# Patient Record
Sex: Female | Born: 1985 | Race: Black or African American | Hispanic: No | Marital: Married | Smoking: Never smoker
Health system: Southern US, Community
[De-identification: ages and names within clinical notes are randomized; demographics above are authoritative.]

## PROBLEM LIST (undated history)

## (undated) DIAGNOSIS — IMO0002 Reserved for concepts with insufficient information to code with codable children: Secondary | ICD-10-CM

## (undated) DIAGNOSIS — N870 Mild cervical dysplasia: Secondary | ICD-10-CM

## (undated) DIAGNOSIS — R51 Headache: Secondary | ICD-10-CM

## (undated) DIAGNOSIS — Z8719 Personal history of other diseases of the digestive system: Secondary | ICD-10-CM

## (undated) DIAGNOSIS — R87629 Unspecified abnormal cytological findings in specimens from vagina: Secondary | ICD-10-CM

## (undated) DIAGNOSIS — B977 Papillomavirus as the cause of diseases classified elsewhere: Secondary | ICD-10-CM

## (undated) DIAGNOSIS — Z8742 Personal history of other diseases of the female genital tract: Secondary | ICD-10-CM

## (undated) DIAGNOSIS — L0293 Carbuncle, unspecified: Secondary | ICD-10-CM

## (undated) DIAGNOSIS — R102 Pelvic and perineal pain: Secondary | ICD-10-CM

## (undated) DIAGNOSIS — Z8619 Personal history of other infectious and parasitic diseases: Secondary | ICD-10-CM

## (undated) DIAGNOSIS — Z87440 Personal history of urinary (tract) infections: Secondary | ICD-10-CM

## (undated) HISTORY — DX: Headache: R51

## (undated) HISTORY — DX: Mild cervical dysplasia: N87.0

## (undated) HISTORY — DX: Pelvic and perineal pain: R10.2

## (undated) HISTORY — DX: Personal history of other diseases of the digestive system: Z87.19

## (undated) HISTORY — DX: Personal history of other diseases of the female genital tract: Z87.42

## (undated) HISTORY — DX: Personal history of other infectious and parasitic diseases: Z86.19

## (undated) HISTORY — DX: Carbuncle, unspecified: L02.93

## (undated) HISTORY — PX: CRYOABLATION: SHX1415

## (undated) HISTORY — PX: WRIST SURGERY: SHX841

## (undated) HISTORY — DX: Personal history of urinary (tract) infections: Z87.440

## (undated) HISTORY — DX: Papillomavirus as the cause of diseases classified elsewhere: B97.7

---
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
## 2005-01-08 DIAGNOSIS — B977 Papillomavirus as the cause of diseases classified elsewhere: Secondary | ICD-10-CM

## 2005-01-08 DIAGNOSIS — R102 Pelvic and perineal pain: Secondary | ICD-10-CM

## 2005-01-08 HISTORY — DX: Pelvic and perineal pain: R10.2

## 2005-01-08 HISTORY — DX: Papillomavirus as the cause of diseases classified elsewhere: B97.7

## 2005-02-27 ENCOUNTER — Ambulatory Visit (HOSPITAL_COMMUNITY): Admission: RE | Admit: 2005-02-27 | Discharge: 2005-02-27 | Payer: Self-pay | Admitting: Obstetrics and Gynecology

## 2005-02-27 ENCOUNTER — Encounter (INDEPENDENT_AMBULATORY_CARE_PROVIDER_SITE_OTHER): Payer: Self-pay | Admitting: *Deleted

## 2005-05-22 ENCOUNTER — Other Ambulatory Visit: Admission: RE | Admit: 2005-05-22 | Discharge: 2005-05-22 | Payer: Self-pay | Admitting: Obstetrics and Gynecology

## 2005-10-21 ENCOUNTER — Other Ambulatory Visit: Admission: RE | Admit: 2005-10-21 | Discharge: 2005-10-21 | Payer: Self-pay | Admitting: Obstetrics and Gynecology

## 2005-12-23 ENCOUNTER — Other Ambulatory Visit: Admission: RE | Admit: 2005-12-23 | Discharge: 2005-12-23 | Payer: Self-pay | Admitting: Obstetrics and Gynecology

## 2006-05-07 ENCOUNTER — Other Ambulatory Visit: Admission: RE | Admit: 2006-05-07 | Discharge: 2006-05-07 | Payer: Self-pay | Admitting: Obstetrics and Gynecology

## 2009-03-08 DIAGNOSIS — Z8719 Personal history of other diseases of the digestive system: Secondary | ICD-10-CM

## 2009-03-08 HISTORY — DX: Personal history of other diseases of the digestive system: Z87.19

## 2009-12-08 HISTORY — PX: WISDOM TOOTH EXTRACTION: SHX21

## 2009-12-08 HISTORY — PX: MOUTH SURGERY: SHX715

## 2010-11-14 ENCOUNTER — Emergency Department (HOSPITAL_COMMUNITY)
Admission: EM | Admit: 2010-11-14 | Discharge: 2010-11-14 | Payer: Self-pay | Source: Home / Self Care | Admitting: Emergency Medicine

## 2011-01-08 DIAGNOSIS — L0293 Carbuncle, unspecified: Secondary | ICD-10-CM

## 2011-01-08 HISTORY — DX: Carbuncle, unspecified: L02.93

## 2011-02-18 LAB — POCT PREGNANCY, URINE: Preg Test, Ur: NEGATIVE

## 2011-04-23 LAB — HEPATITIS B SURFACE ANTIGEN: Hepatitis B Surface Ag: NEGATIVE

## 2011-04-23 LAB — CBC: HCT: 38 % (ref 36–46)

## 2011-04-25 NOTE — Op Note (Signed)
NAME:  Tasha Martin, Tasha Martin                 ACCOUNT NO.:  [id number]   MEDICAL RECORD NO.:  [id number]          PATIENT TYPE:  AMB   LOCATION:  SDC                           FACILITY:  [facility]   PHYSICIAN:  Hal Morales, M.D.DATE OF BIRTH:  07-27-1986   DATE OF PROCEDURE:  02/27/2005  DATE OF DISCHARGE:                                 OPERATIVE REPORT   PREOPERATIVE DIAGNOSIS:  Abnormal genital cytologies, low grade squamous  intraepithelial lesion, rule out endocervical glandular disease   POSTOPERATIVE DIAGNOSIS:  Abnormal genital cytologies, low grade squamous  intraepithelial lesion, rule out endocervical glandular disease   OPERATION:  Cold knife conization of the cervix.   SURGEON:  Hal Morales, M.D.   ANESTHESIA:  Monitored anesthesia care and local.   ESTIMATED BLOOD LOSS:  Less than 25 mL.   COMPLICATIONS:  None.   FINDINGS:  There were no Lugol's nonstaining areas on examination.   PROCEDURE:  A discussion was held with the patient preoperatively concerning  the indications for her procedure as well as the risks involved.  The  patient had a suggestion of endocervical glandular disease on her Pap smear,  low-grade SIL on her Pap smear, and no exocervical lesions noted on  colposcopy x2.  A recommendation was made for sampling of her endocervix  beyond the endocervical curettage.  This could be performed with cold knife  conization.  The risks of anesthesia, bleeding, infection and damage to  adjacent organs were reviewed.  The patient also gave a history of having  discontinued her birth control method approximately a month ago with an  normal menstrual period on February28.  She had intercourse approximately  two weeks ago and understands that the current negative pregnancy test may  not detect a pregnancy that occurred less than two weeks ago.  She  verbalized the desire to proceed rather than delay her procedure further.   PROCEDURE:  The patient was taken to the  operating room after appropriate  identification and placed on the operating table.  After placement of  equipment for monitored anesthesia care, she was placed in the lithotomy  position.  The perineum and vagina were prepped with multiple layers of  Betadine and draped as a sterile field.  A red Robinson catheter was  inserted into the bladder and the bladder emptied.  A weighted speculum was  placed in the posterior vagina.  A paracervical block was achieved with a  total of 10 mL of 2% Xylocaine.  The cervix was then infiltrated with 8 mL  of dilute Pitressin.  Sutures were placed and the 3 and 9 o'clock positions  and tied down.  A cone-shaped portion of the cervix consisting primarily of  endocervix was then removed.  Sturmdorf sutures were placed at the 12 and 6  o'clock position and a piece of Gelfoam  placed in the endocervical cone  bed.  Hemostasis was noted be adequate, and all instruments were removed  from the vagina.  The patient was taken from the  operating room to the recovery room in satisfactory condition having  tolerated  the procedure well, with sponge and instrument counts correct.   SPECIMENS TO PATHOLOGY:  Conization of the cervix.      VPH/MEDQ  D:  02/27/2005  T:  02/27/2005  Job:  [id number]

## 2011-04-25 NOTE — Op Note (Signed)
[facility]  Patient:    Tasha Martin, Tasha Martin                        MRN: [id number] Proc. Date: 03/17/00 Adm. Date:  [id number] Attending:  Sypher, Douglass Rivers CC:         Katy Fitch. Sypher, Montez Hageman., M.D. (2)                           Operative Report  PREOPERATIVE DIAGNOSIS:  Recurrent right dorsal ganglion.  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  OPERATION PERFORMED:  Excision of recurrent dorsal ganglion and arthrotomy of wrist joint with debridement of scapholunate ligament.  SURGEON:  Katy Fitch. Sypher, Montez Hageman., M.D.  ASSISTANT:  Jaquelyn Bitter. Chabon, P.A.  ANESTHESIA:  General LMA.  SUPERVISING ANESTHESIOLOGIST:  Dr. Katrinka Blazing.  INDICATIONS FOR PROCEDURE:  The patient is a 25 year old girl who is status post removal of a dorsal ganglion last year.  Her family was advised prior to the first surgery that ganglions could recur.  Several months after the surgery, she noted a recurrent mass on the dorsal aspect of her right wrist which enlarged and became painful.  She was noted to have a multilobular ganglion clinically.  She returned to the operating room at this time for a second attempt at resection and resolution of this right dorsal ganglion.  DESCRIPTION OF PROCEDURE:   The patient was brought to the operating room and placed in supine position on the operating table.  Following induction of general anesthesia by LMA, the right arm was prepped with Betadine soap and solution and sterilely draped.  Following exsanguination of the limb with an Esmarch bandage, the arterial tourniquet was inflated to 220 mmHg.  The procedure commenced with excision of the previous surgical scar.  Subcutaneous tissues were carefully divided taking care to identify the extensor retinaculum and preserve its transverse fibers.  The cyst was dissected circumferentially between the  second and fourth dorsal compartments and followed down to the dorsal wrist capsule. This was isolated to a stalk that emanated directly off the dorsal aspect of the scapholunate ligament.  A radial carpal arthrotomy was completed and the stalk excised.  With wrist motion, there was noted to be mucinous material extruding from the scapholunate ligament.  This was curetted with a House curet and electrocauterized.  It is likely that this is the source of the recurring ganglion.  I carefully electrocauterized the feeding vessels on the dorsal capsule.  The capsule was repaired with mattress suture of 3-0 Vicryl followed by repair of the skin with intradermal 3-0 Prolene.  A compressive dressing was applied with Xeroflo, sterile gauze and a volar plaster splint. DD:  03/17/00 TD:  03/17/00 Job: [id number]

## 2011-05-08 ENCOUNTER — Inpatient Hospital Stay (HOSPITAL_COMMUNITY)
Admission: AD | Admit: 2011-05-08 | Discharge: 2011-05-08 | Disposition: A | Discharge status: Home / Self Care | Payer: 59 | Source: Ambulatory Visit | Attending: Obstetrics and Gynecology | Admitting: Obstetrics and Gynecology

## 2011-05-08 DIAGNOSIS — R51 Headache: Secondary | ICD-10-CM | POA: Insufficient documentation

## 2011-05-08 DIAGNOSIS — O99891 Other specified diseases and conditions complicating pregnancy: Secondary | ICD-10-CM | POA: Insufficient documentation

## 2011-05-08 DIAGNOSIS — E860 Dehydration: Secondary | ICD-10-CM | POA: Insufficient documentation

## 2011-05-08 LAB — URINALYSIS, ROUTINE W REFLEX MICROSCOPIC
Bilirubin Urine: NEGATIVE
Glucose, UA: NEGATIVE mg/dL
Hgb urine dipstick: NEGATIVE
Ketones, ur: NEGATIVE mg/dL
Nitrite: NEGATIVE
Protein, ur: NEGATIVE mg/dL
Specific Gravity, Urine: 1.025 (ref 1.005–1.030)
Urobilinogen, UA: 0.2 mg/dL (ref 0.0–1.0)
pH: 6 (ref 5.0–8.0)

## 2011-09-10 ENCOUNTER — Inpatient Hospital Stay (HOSPITAL_COMMUNITY)
Admission: AD | Admit: 2011-09-10 | Discharge: 2011-09-10 | Disposition: A | Discharge status: Home / Self Care | Payer: 59 | Source: Ambulatory Visit | Attending: Obstetrics and Gynecology | Admitting: Obstetrics and Gynecology

## 2011-09-10 DIAGNOSIS — Z3480 Encounter for supervision of other normal pregnancy, unspecified trimester: Secondary | ICD-10-CM | POA: Insufficient documentation

## 2011-09-10 DIAGNOSIS — Z2989 Encounter for other specified prophylactic measures: Secondary | ICD-10-CM | POA: Insufficient documentation

## 2011-09-10 DIAGNOSIS — Z298 Encounter for other specified prophylactic measures: Secondary | ICD-10-CM | POA: Insufficient documentation

## 2011-09-10 LAB — ABO/RH: RH Type: NEGATIVE

## 2011-09-10 MED ORDER — RHO D IMMUNE GLOBULIN 1500 UNIT/2ML IJ SOLN
300.0000 ug | Freq: Once | INTRAMUSCULAR | Status: AC
  Administered 2011-09-10: 300 ug via INTRAMUSCULAR
  Filled 2011-09-10: qty 2

## 2011-09-10 NOTE — Progress Notes (Signed)
Sent from office with orders for blood work and rhophyllac.  NKDA.  Has never received previously. Handout given and explained time frame related to blood draw and injection.  [facility] 11/16/11

## 2011-09-11 LAB — RH IG WORKUP (INCLUDES ABO/RH)
ABO/RH(D): O NEG
Unit division: 0

## 2011-10-04 ENCOUNTER — Encounter (HOSPITAL_COMMUNITY): Payer: Self-pay | Admitting: *Deleted

## 2011-10-04 ENCOUNTER — Inpatient Hospital Stay (HOSPITAL_COMMUNITY): Payer: No Typology Code available for payment source

## 2011-10-04 ENCOUNTER — Observation Stay (HOSPITAL_COMMUNITY)
Admission: AD | Admit: 2011-10-04 | Discharge: 2011-10-05 | Disposition: A | Discharge status: Home / Self Care | Payer: No Typology Code available for payment source | Source: Ambulatory Visit | Attending: Obstetrics and Gynecology | Admitting: Obstetrics and Gynecology

## 2011-10-04 DIAGNOSIS — Y92410 Unspecified street and highway as the place of occurrence of the external cause: Secondary | ICD-10-CM | POA: Insufficient documentation

## 2011-10-04 DIAGNOSIS — O479 False labor, unspecified: Secondary | ICD-10-CM

## 2011-10-04 DIAGNOSIS — O4700 False labor before 37 completed weeks of gestation, unspecified trimester: Principal | ICD-10-CM | POA: Insufficient documentation

## 2011-10-04 HISTORY — DX: Reserved for concepts with insufficient information to code with codable children: IMO0002

## 2011-10-04 LAB — CBC
Hemoglobin: 10.9 g/dL — ABNORMAL LOW (ref 12.0–15.0)
MCH: 31.2 pg (ref 26.0–34.0)
MCHC: 32.9 g/dL (ref 30.0–36.0)
RDW: 12.8 % (ref 11.5–15.5)

## 2011-10-04 LAB — WET PREP, GENITAL
Trich, Wet Prep: NONE SEEN
Yeast Wet Prep HPF POC: NONE SEEN

## 2011-10-04 LAB — KLEIHAUER-BETKE STAIN: Fetal Cells %: 0 %

## 2011-10-04 MED ORDER — DOCUSATE SODIUM 100 MG PO CAPS
100.0000 mg | ORAL_CAPSULE | Freq: Every day | ORAL | Status: DC
  Administered 2011-10-05: 100 mg via ORAL
  Filled 2011-10-04 (×4): qty 1

## 2011-10-04 MED ORDER — PRENATAL PLUS 27-1 MG PO TABS
1.0000 | ORAL_TABLET | Freq: Every day | ORAL | Status: DC
  Administered 2011-10-05: 1 via ORAL
  Filled 2011-10-04: qty 1

## 2011-10-04 MED ORDER — LACTATED RINGERS IV SOLN
INTRAVENOUS | Status: DC
  Administered 2011-10-04 – 2011-10-05 (×4): via INTRAVENOUS

## 2011-10-04 MED ORDER — BETAMETHASONE SOD PHOS & ACET 6 (3-3) MG/ML IJ SUSP
12.5000 mg | INTRAMUSCULAR | Status: AC
  Administered 2011-10-04 – 2011-10-05 (×2): 12.5 mg via INTRAMUSCULAR
  Filled 2011-10-04 (×2): qty 2.1

## 2011-10-04 MED ORDER — TERBUTALINE SULFATE 1 MG/ML IJ SOLN: 0.2500 mg | Freq: Once | INTRAMUSCULAR | Status: AC | PRN

## 2011-10-04 MED ORDER — ZOLPIDEM TARTRATE 10 MG PO TABS: 10.0000 mg | ORAL_TABLET | Freq: Every evening | ORAL | Status: DC | PRN

## 2011-10-04 MED ORDER — CALCIUM CARBONATE ANTACID 500 MG PO CHEW
2.0000 | CHEWABLE_TABLET | ORAL | Status: DC | PRN
  Filled 2011-10-04: qty 2

## 2011-10-04 MED ORDER — ACETAMINOPHEN 325 MG PO TABS: 650.0000 mg | ORAL_TABLET | ORAL | Status: DC | PRN
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
## 2011-10-05 NOTE — Discharge Summary (Signed)
Discharge instructions reviewed with patient, copy given.  Patient verbalized understanding of all discharge instructions. No prescriptions given.
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
## 2011-10-07 LAB — STREP B DNA PROBE: Special Requests: NORMAL

## 2011-10-07 LAB — GC/CHLAMYDIA PROBE AMP, GENITAL: Chlamydia, DNA Probe: UNDETERMINED

## 2011-11-21 ENCOUNTER — Other Ambulatory Visit: Payer: Self-pay | Admitting: Obstetrics and Gynecology

## 2011-11-23 ENCOUNTER — Encounter (HOSPITAL_COMMUNITY): Payer: Self-pay | Admitting: Anesthesiology

## 2011-11-23 ENCOUNTER — Encounter (HOSPITAL_COMMUNITY)
Admission: AD | Disposition: A | Discharge status: Home / Self Care | Payer: Self-pay | Source: Ambulatory Visit | Attending: Obstetrics and Gynecology

## 2011-11-23 ENCOUNTER — Encounter (HOSPITAL_COMMUNITY): Payer: Self-pay | Admitting: Obstetrics and Gynecology

## 2011-11-23 ENCOUNTER — Inpatient Hospital Stay (HOSPITAL_COMMUNITY): Payer: 59 | Admitting: Anesthesiology

## 2011-11-23 ENCOUNTER — Inpatient Hospital Stay (HOSPITAL_COMMUNITY)
Admission: AD | Admit: 2011-11-23 | Discharge: 2011-11-26 | DRG: 766 | Disposition: A | Discharge status: Home / Self Care | Payer: 59 | Source: Ambulatory Visit | Attending: Obstetrics and Gynecology | Admitting: Obstetrics and Gynecology

## 2011-11-23 DIAGNOSIS — Z6791 Unspecified blood type, Rh negative: Secondary | ICD-10-CM

## 2011-11-23 DIAGNOSIS — Z22330 Carrier of Group B streptococcus: Secondary | ICD-10-CM

## 2011-11-23 DIAGNOSIS — Z3490 Encounter for supervision of normal pregnancy, unspecified, unspecified trimester: Secondary | ICD-10-CM

## 2011-11-23 DIAGNOSIS — Z98891 History of uterine scar from previous surgery: Secondary | ICD-10-CM

## 2011-11-23 DIAGNOSIS — O26899 Other specified pregnancy related conditions, unspecified trimester: Secondary | ICD-10-CM

## 2011-11-23 DIAGNOSIS — Z8742 Personal history of other diseases of the female genital tract: Secondary | ICD-10-CM

## 2011-11-23 DIAGNOSIS — O99892 Other specified diseases and conditions complicating childbirth: Secondary | ICD-10-CM | POA: Diagnosis present

## 2011-11-23 LAB — CBC
HCT: 35.5 % — ABNORMAL LOW (ref 36.0–46.0)
Hemoglobin: 11.7 g/dL — ABNORMAL LOW (ref 12.0–15.0)
MCHC: 33 g/dL (ref 30.0–36.0)
RDW: 15.3 % (ref 11.5–15.5)
WBC: 10.6 10*3/uL — ABNORMAL HIGH (ref 4.0–10.5)

## 2011-11-23 LAB — AMNISURE RUPTURE OF MEMBRANE (ROM) NOT AT ARMC
Amnisure ROM: INVALID
Amnisure ROM: NEGATIVE

## 2011-11-23 LAB — RPR: RPR Ser Ql: NONREACTIVE

## 2011-11-23 SURGERY — Surgical Case
Anesthesia: Epidural | Site: Abdomen | Wound class: Clean Contaminated

## 2011-11-23 MED ORDER — ACETAMINOPHEN 325 MG PO TABS: 650.0000 mg | ORAL_TABLET | ORAL | Status: DC | PRN

## 2011-11-23 MED ORDER — EPHEDRINE 5 MG/ML INJ: 10.0000 mg | INTRAVENOUS | Status: DC | PRN

## 2011-11-23 MED ORDER — LACTATED RINGERS IV SOLN
INTRAVENOUS | Status: DC
  Administered 2011-11-23: 19:00:00 via INTRAUTERINE

## 2011-11-23 MED ORDER — FENTANYL CITRATE 0.05 MG/ML IJ SOLN
INTRAMUSCULAR | Status: DC | PRN
  Administered 2011-11-23: 100 ug via INTRAVENOUS

## 2011-11-23 MED ORDER — PHENYLEPHRINE 40 MCG/ML (10ML) SYRINGE FOR IV PUSH (FOR BLOOD PRESSURE SUPPORT): 80.0000 ug | PREFILLED_SYRINGE | INTRAVENOUS | Status: DC | PRN

## 2011-11-23 MED ORDER — FLEET ENEMA 7-19 GM/118ML RE ENEM: 1.0000 | ENEMA | RECTAL | Status: DC | PRN

## 2011-11-23 MED ORDER — CEFAZOLIN SODIUM 1-5 GM-% IV SOLN
INTRAVENOUS | Status: AC
Start: 2011-11-23 — End: 2011-11-23
  Filled 2011-11-23: qty 100

## 2011-11-23 MED ORDER — OXYTOCIN BOLUS FROM INFUSION
500.0000 mL | Freq: Once | INTRAVENOUS | Status: DC
  Filled 2011-11-23: qty 500

## 2011-11-23 MED ORDER — LACTATED RINGERS IV SOLN
INTRAVENOUS | Status: DC | PRN
  Administered 2011-11-23 (×3): via INTRAVENOUS

## 2011-11-23 MED ORDER — MORPHINE SULFATE (PF) 0.5 MG/ML IJ SOLN
INTRAMUSCULAR | Status: DC | PRN
  Administered 2011-11-23: 4 mg via EPIDURAL

## 2011-11-23 MED ORDER — OXYTOCIN 10 UNIT/ML IJ SOLN
INTRAMUSCULAR | Status: AC
  Filled 2011-11-23: qty 4

## 2011-11-23 MED ORDER — LIDOCAINE HCL (PF) 1 % IJ SOLN: 30.0000 mL | INTRAMUSCULAR | Status: DC | PRN

## 2011-11-23 MED ORDER — ONDANSETRON HCL 4 MG/2ML IJ SOLN: 4.0000 mg | Freq: Four times a day (QID) | INTRAMUSCULAR | Status: DC | PRN

## 2011-11-23 MED ORDER — OXYTOCIN 20 UNITS IN LACTATED RINGERS INFUSION - SIMPLE
125.0000 mL/h | Freq: Once | INTRAVENOUS | Status: DC
  Filled 2011-11-23: qty 1000

## 2011-11-23 MED ORDER — OXYTOCIN 20 UNITS IN LACTATED RINGERS INFUSION - SIMPLE
1.0000 m[IU]/min | INTRAVENOUS | Status: DC
  Administered 2011-11-23: 1 m[IU]/min via INTRAVENOUS

## 2011-11-23 MED ORDER — CITRIC ACID-SODIUM CITRATE 334-500 MG/5ML PO SOLN
30.0000 mL | Freq: Once | ORAL | Status: AC
  Administered 2011-11-23: 30 mL via ORAL

## 2011-11-23 MED ORDER — LIDOCAINE HCL 1.5 % IJ SOLN
INTRAMUSCULAR | Status: DC | PRN
  Administered 2011-11-23 (×2): 5 mL via EPIDURAL

## 2011-11-23 MED ORDER — DIPHENHYDRAMINE HCL 50 MG/ML IJ SOLN: 12.5000 mg | INTRAMUSCULAR | Status: DC | PRN

## 2011-11-23 MED ORDER — LACTATED RINGERS IV SOLN: 500.0000 mL | Freq: Once | INTRAVENOUS | Status: DC

## 2011-11-23 MED ORDER — MEPERIDINE HCL 25 MG/ML IJ SOLN
INTRAMUSCULAR | Status: DC | PRN
  Administered 2011-11-23: 25 mg via INTRAVENOUS

## 2011-11-23 MED ORDER — TERBUTALINE SULFATE 1 MG/ML IJ SOLN: 0.2500 mg | Freq: Once | INTRAMUSCULAR | Status: DC | PRN

## 2011-11-23 MED ORDER — FENTANYL CITRATE 0.05 MG/ML IJ SOLN
INTRAMUSCULAR | Status: AC
  Filled 2011-11-23: qty 2

## 2011-11-23 MED ORDER — BUPIVACAINE-EPINEPHRINE 0.5% -1:200000 IJ SOLN
INTRAMUSCULAR | Status: DC | PRN
  Administered 2011-11-23: 10 mL

## 2011-11-23 MED ORDER — SODIUM BICARBONATE 8.4 % IV SOLN
INTRAVENOUS | Status: AC
  Filled 2011-11-23: qty 50

## 2011-11-23 MED ORDER — PHENYLEPHRINE 40 MCG/ML (10ML) SYRINGE FOR IV PUSH (FOR BLOOD PRESSURE SUPPORT)
PREFILLED_SYRINGE | INTRAVENOUS | Status: AC
  Filled 2011-11-23: qty 5

## 2011-11-23 MED ORDER — PHENYLEPHRINE 40 MCG/ML (10ML) SYRINGE FOR IV PUSH (FOR BLOOD PRESSURE SUPPORT)
PREFILLED_SYRINGE | INTRAVENOUS | Status: AC
  Filled 2011-11-23: qty 10

## 2011-11-23 MED ORDER — OXYCODONE-ACETAMINOPHEN 5-325 MG PO TABS: 2.0000 | ORAL_TABLET | ORAL | Status: DC | PRN

## 2011-11-23 MED ORDER — SODIUM BICARBONATE 8.4 % IV SOLN
INTRAVENOUS | Status: DC | PRN
  Administered 2011-11-23: 5 mL via EPIDURAL

## 2011-11-23 MED ORDER — EPHEDRINE 5 MG/ML INJ
10.0000 mg | INTRAVENOUS | Status: DC | PRN
  Filled 2011-11-23: qty 4

## 2011-11-23 MED ORDER — SODIUM CHLORIDE 0.9 % IR SOLN
Status: DC | PRN
  Administered 2011-11-23 (×4): 1

## 2011-11-23 MED ORDER — OXYTOCIN 10 UNIT/ML IJ SOLN
20.0000 [IU] | INTRAVENOUS | Status: DC | PRN
  Administered 2011-11-23: 20 [IU] via INTRAVENOUS

## 2011-11-23 MED ORDER — MEPERIDINE HCL 25 MG/ML IJ SOLN
INTRAMUSCULAR | Status: AC
Start: 2011-11-23 — End: 2011-11-23
  Filled 2011-11-23: qty 1

## 2011-11-23 MED ORDER — LACTATED RINGERS IV SOLN
INTRAVENOUS | Status: DC
  Administered 2011-11-23 (×2): via INTRAVENOUS

## 2011-11-23 MED ORDER — PENICILLIN G POTASSIUM 5000000 UNITS IJ SOLR
2.5000 10*6.[IU] | INTRAMUSCULAR | Status: DC
  Administered 2011-11-23 (×2): 2.5 10*6.[IU] via INTRAVENOUS
  Filled 2011-11-23 (×7): qty 2.5

## 2011-11-23 MED ORDER — PHENYLEPHRINE 40 MCG/ML (10ML) SYRINGE FOR IV PUSH (FOR BLOOD PRESSURE SUPPORT)
80.0000 ug | PREFILLED_SYRINGE | INTRAVENOUS | Status: DC | PRN
  Filled 2011-11-23: qty 5

## 2011-11-23 MED ORDER — ONDANSETRON HCL 4 MG/2ML IJ SOLN
INTRAMUSCULAR | Status: AC
  Filled 2011-11-23: qty 2

## 2011-11-23 MED ORDER — MORPHINE SULFATE (PF) 0.5 MG/ML IJ SOLN
INTRAMUSCULAR | Status: DC | PRN
  Administered 2011-11-23: 1 mg via INTRAVENOUS

## 2011-11-23 MED ORDER — LACTATED RINGERS IV SOLN: 500.0000 mL | INTRAVENOUS | Status: DC | PRN

## 2011-11-23 MED ORDER — CITRIC ACID-SODIUM CITRATE 334-500 MG/5ML PO SOLN: 30.0000 mL | ORAL | Status: DC | PRN

## 2011-11-23 MED ORDER — CITRIC ACID-SODIUM CITRATE 334-500 MG/5ML PO SOLN
ORAL | Status: AC
  Filled 2011-11-23: qty 15

## 2011-11-23 MED ORDER — MORPHINE SULFATE 0.5 MG/ML IJ SOLN
INTRAMUSCULAR | Status: AC
  Filled 2011-11-23: qty 10

## 2011-11-23 MED ORDER — PENICILLIN G POTASSIUM 5000000 UNITS IJ SOLR
5.0000 10*6.[IU] | Freq: Once | INTRAMUSCULAR | Status: AC
  Administered 2011-11-23: 5 10*6.[IU] via INTRAVENOUS
  Filled 2011-11-23: qty 5

## 2011-11-23 MED ORDER — IBUPROFEN 600 MG PO TABS: 600.0000 mg | ORAL_TABLET | Freq: Four times a day (QID) | ORAL | Status: DC | PRN

## 2011-11-23 MED ORDER — FENTANYL 2.5 MCG/ML BUPIVACAINE 1/10 % EPIDURAL INFUSION (WH - ANES)
INTRAMUSCULAR | Status: DC | PRN
  Administered 2011-11-23: 14 mL/h via EPIDURAL

## 2011-11-23 MED ORDER — PHENYLEPHRINE HCL 10 MG/ML IJ SOLN
INTRAMUSCULAR | Status: DC | PRN
  Administered 2011-11-23 (×2): 120 ug via INTRAVENOUS
  Administered 2011-11-23 (×2): 80 ug via INTRAVENOUS

## 2011-11-23 MED ORDER — CEFAZOLIN SODIUM 1-5 GM-% IV SOLN
INTRAVENOUS | Status: DC | PRN
  Administered 2011-11-23: 2 g via INTRAVENOUS

## 2011-11-23 MED ORDER — ONDANSETRON HCL 4 MG/2ML IJ SOLN
INTRAMUSCULAR | Status: DC | PRN
  Administered 2011-11-23: 4 mg via INTRAVENOUS

## 2011-11-23 MED ORDER — FENTANYL 2.5 MCG/ML BUPIVACAINE 1/10 % EPIDURAL INFUSION (WH - ANES)
14.0000 mL/h | INTRAMUSCULAR | Status: DC
  Filled 2011-11-23: qty 60

## 2011-11-23 MED ORDER — FENTANYL 2.5 MCG/ML BUPIVACAINE 1/10 % EPIDURAL INFUSION (WH - ANES): 14.0000 mL/h | INTRAMUSCULAR | Status: DC

## 2011-11-23 MED ORDER — BUTORPHANOL TARTRATE 2 MG/ML IJ SOLN
2.0000 mg | INTRAMUSCULAR | Status: DC | PRN
  Administered 2011-11-23 (×2): 2 mg via INTRAVENOUS
  Filled 2011-11-23 (×2): qty 1

## 2011-11-23 MED ORDER — LIDOCAINE-EPINEPHRINE (PF) 2 %-1:200000 IJ SOLN
INTRAMUSCULAR | Status: AC
  Filled 2011-11-23: qty 20

## 2011-11-23 SURGICAL SUPPLY — 40 items
CHLORAPREP W/TINT 26ML (MISCELLANEOUS) ×2 IMPLANT
CLOTH BEACON ORANGE TIMEOUT ST (SAFETY) ×2 IMPLANT
CONTAINER PREFILL 10% NBF 15ML (MISCELLANEOUS) IMPLANT
DRAIN JACKSON PRT FLT 7MM (DRAIN) IMPLANT
DRESSING TELFA 8X3 (GAUZE/BANDAGES/DRESSINGS) ×2 IMPLANT
ELECT REM PT RETURN 9FT ADLT (ELECTROSURGICAL) ×2
ELECTRODE REM PT RTRN 9FT ADLT (ELECTROSURGICAL) ×1 IMPLANT
EVACUATOR SILICONE 100CC (DRAIN) IMPLANT
EXTRACTOR VACUUM M CUP 4 TUBE (SUCTIONS) IMPLANT
GAUZE SPONGE 4X4 12PLY STRL LF (GAUZE/BANDAGES/DRESSINGS) ×3 IMPLANT
GLOVE BIOGEL PI IND STRL 8.5 (GLOVE) ×1 IMPLANT
GLOVE BIOGEL PI INDICATOR 8.5 (GLOVE) ×1
GLOVE ECLIPSE 8.0 STRL XLNG CF (GLOVE) ×4 IMPLANT
GOWN PREVENTION PLUS LG XLONG (DISPOSABLE) ×4 IMPLANT
GOWN PREVENTION PLUS XXLARGE (GOWN DISPOSABLE) ×2 IMPLANT
KIT ABG SYR 3ML LUER SLIP (SYRINGE) ×1 IMPLANT
NDL HYPO 25X1 1.5 SAFETY (NEEDLE) ×1 IMPLANT
NDL HYPO 25X5/8 SAFETYGLIDE (NEEDLE) IMPLANT
NEEDLE HYPO 25X1 1.5 SAFETY (NEEDLE) ×2 IMPLANT
NEEDLE HYPO 25X5/8 SAFETYGLIDE (NEEDLE) ×2 IMPLANT
PACK C SECTION WH (CUSTOM PROCEDURE TRAY) ×2 IMPLANT
PAD ABD 7.5X8 STRL (GAUZE/BANDAGES/DRESSINGS) ×2 IMPLANT
RINGERS IRRIG 1000ML POUR BTL (IV SOLUTION) ×2 IMPLANT
SLEEVE SCD COMPRESS KNEE MED (MISCELLANEOUS) ×1 IMPLANT
STAPLER VISISTAT 35W (STAPLE) IMPLANT
SUT MNCRL AB 3-0 PS2 27 (SUTURE) IMPLANT
SUT PLAIN 0 NONE (SUTURE) IMPLANT
SUT SILK 3 0 FS 1X18 (SUTURE) IMPLANT
SUT VIC AB 0 CT1 27 (SUTURE) ×4
SUT VIC AB 0 CT1 27XBRD ANBCTR (SUTURE) ×2 IMPLANT
SUT VIC AB 2-0 CTX 36 (SUTURE) ×5 IMPLANT
SUT VIC AB 3-0 CT1 27 (SUTURE)
SUT VIC AB 3-0 CT1 TAPERPNT 27 (SUTURE) IMPLANT
SUT VIC AB 3-0 SH 27 (SUTURE)
SUT VIC AB 3-0 SH 27X BRD (SUTURE) IMPLANT
SYR CONTROL 10ML LL (SYRINGE) ×2 IMPLANT
TAPE CLOTH SURG 4X10 WHT LF (GAUZE/BANDAGES/DRESSINGS) ×1 IMPLANT
TOWEL OR 17X24 6PK STRL BLUE (TOWEL DISPOSABLE) ×4 IMPLANT
TRAY FOLEY CATH 14FR (SET/KITS/TRAYS/PACK) ×1 IMPLANT
WATER STERILE IRR 1000ML POUR (IV SOLUTION) ×2 IMPLANT

## 2011-11-23 NOTE — Anesthesia Postprocedure Evaluation (Signed)
Anesthesia Post Note  Patient: Tasha Martin  Procedure(s) Performed:  CESAREAN SECTION - primary cesarean section of baby girl at 2248  APGAR  9/9  Anesthesia type: Epidural  Patient location: PACU  Post pain: Pain level controlled  Post assessment: Post-op Vital signs reviewed  Last Vitals:  Filed Vitals:   11/23/11 2332  BP: 120/54  Pulse: 104  Temp: 36.9 C  Resp: 16    Post vital signs: Reviewed  Level of consciousness: awake  Complications: No apparent anesthesia complications

## 2011-11-23 NOTE — Progress Notes (Signed)
Patient ID: Tasha Martin, female   DOB: 1986/02/05, 25 y.o.   MRN: [id number] .Subjective: Remains comfortable with epidural   Objective: BP 119/69  Pulse 110  Temp(Src) 98.1 F (36.7 C) (Oral)  Resp 18  Ht 5\' 5"  (1.651 m)  Wt 96.616 kg (213 lb)  BMI 35.44 kg/m2  LMP 02/09/2011   FHT:  FHR: 150 bpm, variability: moderate,  accelerations:  Abscent,  decelerations:  Present varibles, prolonged UC:   regular, every 3-6 minutes, remains dysfunctional SVE:   Dilation: 4 Effacement (%): 100 Station: -1 Exam by:: S. Kamyia Thomason, CNM  Pitocin off  Assessment / Plan: minimal cervical change, FHR decels GBS pos   Fetal Wellbeing:  Category II Pain Control:  Epidural  D/W Dr Stefano Gaul will proceed with C/S Reviewed R/B/A with pt including risks of infection, bleeding, anesthesia complications or damage to internal organs. Pt verbalized understanding and agrees to proceed with C/S.   Malissa Hippo 11/23/2011, 10:06 PM
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
## 2011-11-23 NOTE — OR Nursing (Signed)
Fundal massage DLWegner RN , cord ph 7.26

## 2011-11-23 NOTE — Op Note (Signed)
OPERATIVE NOTE  Patient's Name: Tasha Martin  Date of Birth: 09/13/1986  Medical Records Number: [id number]  Date of Operation: 11/23/2011  Preoperative diagnosis:  [redacted]w[redacted]d weeks gestation  fetal intolerance to labor  Failure to progress in labor  Postoperative diagnosis:  [redacted]w[redacted]d weeks gestation  fetal intolerance to labor  Failure to progress in labor  Occiput posterior presentation  Procedure: Primary CESAREAN SECTION  Surgeon:  Leonard Schwartz, M.D.  Assistant:  Sanda Klein, certified nurse midwife  Anesthesia:  Epidural  Disposition:  [name] is a 25 y.o. female, gravida 1 para 0-0-0-0, who presents at [redacted]w[redacted]d weeks gestation. The patient has been followed at the [facility] obstetrics and gynecology division of [facility]. She has the above mentioned diagnosis. She understands the indications for her procedure and she accepts the risk of, but not limited to, anesthetic complications, bleeding, infections, and possible damage to the surrounding organs.  Findings:  A 7 pound 6 ounce female ([name]) was delivered from a occiput posterior position.  The Apgar scores were 9/9 . The uterus, fallopian tubes, and ovaries were normal for the gravid state. The arterial cord blood pH was 7.26.  Procedure:  The patient was taken to the operating room where it was determined that the epidural she received her labor would be adequate for cesarean delivery. The patient's abdomen was prepped with Chloraprep. A Foley catheter had previously been placed The patient was sterilely draped. The lower abdomen was injected with half percent Marcaine with epinephrine. A low transverse incision was made in the abdomen and carried sharply through the subcutaneous tissue, the fascia, and the anterior peritoneum. An incision was made in the lower uterine segment. The incision was extended in a low transverse fashion. The membranes were ruptured. The  fetal head was delivered without difficulty. The mouth and nose were suctioned. The remainder of the infant was then delivered. The cord was clamped and cut. The infant was handed to the awaiting pediatric team. The placenta was removed. The uterine cavity was cleaned of amniotic fluid, clotted blood, and membranes. The uterine incision was closed using a running locking suture of 2-0 Vicryl. An imbricating suture of 2-0 Vicryl was placed. The pelvis was vigorously irrigated. Hemostasis was adequate. The anterior peritoneum and the abdominal musculature were closed using 2-0 Vicryl. The fascia was closed using a running suture of 0 Vicryl followed by 3 interrupted sutures of 0 Vicryl. The subcutaneous layer was closed using interrupted sutures of 2-0 Vicryl. The skin was reapproximated using a subcuticular suture of 3-0 Monocryl. Sponge, needle, and instrument counts were correct on 2 occasions. The estimated blood loss for the procedure was 600 cc. The patient tolerated her procedure well. She was transported to the recovery room in stable condition. The infant was taken to the full-term nursery in stable condition. The placenta was sent to labor and delivery.  Leonard Schwartz, M.D.

## 2011-11-23 NOTE — Progress Notes (Signed)
Patient ID: HAYLEN BELLOTTI, female   DOB: 21-Jan-1986, 25 y.o.   MRN: [id number] .Subjective:  Up walking in hall  Objective: BP 119/64  Pulse 90  Temp(Src) 98.9 F (37.2 C) (Oral)  Resp 20  Ht 5\' 5"  (1.651 m)  Wt 96.616 kg (213 lb)  BMI 35.44 kg/m2  LMP 02/09/2011   FHT:  FHR: 130 bpm, variability: moderate,  accelerations:  Present,  decelerations:  Present late decels, mild variables UC:   regular, every 6-7 minutes SVE:   Dilation: 1.5 Effacement (%): 70 Station: -2 Exam by:: J. Rasch RN     Assessment / Plan: Spontaneous labor, progressing normally GBS pos   Fetal Wellbeing:  Category II Pain Control:  Labor support without medications Will CTO FHR - overall reassuring, had series of late decels  Ctx have spaced out, consider pitocin aug PRN  Update physician PRN  Malissa Hippo 11/23/2011, 10:56 AM

## 2011-11-23 NOTE — Progress Notes (Signed)
The patient has not progressed and her labor. She has had continued prolonged decelerations in spite of amnioinfusion. Management options have been reviewed. The patient is ready to proceed with cesarean delivery. The specific risks have been reviewed including, but not limited to, anesthetic complications, bleeding, infections, and possible damage to the surrounding organs.  Mylinda Latina.D.

## 2011-11-23 NOTE — Anesthesia Procedure Notes (Signed)
Epidural Patient location during procedure: OB Start time: 11/23/2011 8:54 PM End time: 11/23/2011 8:59 PM Reason for block: procedure for pain  Staffing Anesthesiologist: Sandrea Hughs Performed by: anesthesiologist   Preanesthetic Checklist Completed: patient identified, site marked, surgical consent, pre-op evaluation, timeout performed, IV checked, risks and benefits discussed and monitors and equipment checked  Epidural Patient position: sitting Prep: site prepped and draped and DuraPrep Patient monitoring: continuous pulse ox and blood pressure Approach: midline Injection technique: LOR air  Needle:  Needle type: Tuohy  Needle gauge: 17 G Needle length: 9 cm Needle insertion depth: 7 cm Catheter type: closed end flexible Catheter size: 19 Gauge Catheter at skin depth: 13 cm Test dose: negative and 1.5% lidocaine  Assessment Sensory level: T8 Events: blood not aspirated, injection not painful, no injection resistance, negative IV test and no paresthesia

## 2011-11-23 NOTE — Progress Notes (Signed)
Patient ID: Tasha Martin, female   DOB: 1986-03-12, 25 y.o.   MRN: [id number] .Subjective: Coping well, requests stadol now   Objective: BP 121/66  Pulse 98  Temp(Src) 98.6 F (37 C) (Oral)  Resp 20  Ht 5\' 5"  (1.651 m)  Wt 96.616 kg (213 lb)  BMI 35.44 kg/m2  LMP 02/09/2011   FHT:  FHR: 130 bpm, variability: moderate,  accelerations:  Present,  decelerations:  Present early mild variables UC:   regular, every 4-6 minutes, coupling now SVE:   Dilation: 3 Effacement (%): 100 Station: -1 Exam by:: Tasha [name], rn  Pitocin at 9mu  Assessment / Plan: no cervical change AROM - clear fluid GBS pos   Fetal Wellbeing:  Category I Pain Control:  Labor support without medications Will continue with pitocin Stadol for pain Will place IUPC if no cervical change in 2 hours  Update physician PRN  Tasha Martin 11/23/2011, 5:56 PM

## 2011-11-23 NOTE — Progress Notes (Signed)
Patient ID: Tasha Martin, female   DOB: [birth year], 25 y.o.   MRN: [id number] .Subjective:  Coping well, denies needs   Objective: BP 125/76  Pulse 84  Temp(Src) 97.7 F (36.5 C) (Oral)  Resp 20  Ht 5\' 5"  (1.651 m)  Wt 96.616 kg (213 lb)  BMI 35.44 kg/m2  LMP 02/09/2011   FHT:  FHR: 130 bpm, variability: moderate,  accelerations:  Present,  decelerations:  Absent UC:   irregular, every 4-7 minutes, sometimes lasting up to 2 min SVE:   Dilation: 3 Effacement (%): 100 Station: -2 Exam by:: shelly Zigmund Linse, cnm  Pitocin at 1mu  Assessment / Plan: Spontaneous labor, progressing normally GBS pos, rcv'd PCN   Fetal Wellbeing:  Category I FHR reassuring Pain Control:  Labor support without medications Augment with pitocin  Update physician PRN  Malissa Hippo 11/23/2011, 2:14 PM

## 2011-11-23 NOTE — ED Notes (Signed)
Lab tech called and said the [facility] sure was not detectable due to error in the results reader. Will re-do test, S. Lillard CNM notified.

## 2011-11-23 NOTE — ED Provider Notes (Signed)
History     Chief Complaint  Patient presents with  . Contractions   HPI Pt is a G 1 P 0 who presents at 41.0 wks with c/o uterine contractions with increased frequency and intensity.  Reports onset of UCs at approximately midnight.  She has also noted some bloody mucus and some clear vaginal discharge.  States her fetus has been moving normally.    Pregnancy remarkable for: Positive chlamydia at 35 wks, txed with neg TOC Ultrasound with LV EIF  Entered care at [redacted] wks gestation.  Followed by CNM service.  Seen for contractions at 33 wks 4 days with positive FFN and indeterminate chlamydia.  Ultrasounds performed at 18wks for anatomy with ltd visualization of some structures, 23wks at which time EIF noted and remainder of anatomy visualized as well as at 31w 5d and [redacted]w[redacted]d with appropriate fetal growth and persistent LV EIF.  Remainder of prenatal course uneventful.  Pos GBS.    OB History    Grav Para Term Preterm Abortions TAB SAB Ect Mult Living   1 0 0 0 0 0 0 0 0 0       Past Medical History  Diagnosis Date  . Abnormal Pap smear     Past Surgical History  Procedure Date  . Wrist surgery   . Mouth surgery 2011  . Cryoablation     Family History  Problem Relation Age of Onset  . Hypertension Mother   . Hypertension Father   . COPD Father   . Heart disease Father   . Arthritis Maternal Grandmother   . Diabetes Maternal Grandmother   . Hypertension Maternal Grandmother   . Arthritis Maternal Grandfather   . Diabetes Maternal Grandfather   . Hypertension Maternal Grandfather   . Hypertension Paternal Grandmother   . Hypertension Paternal Grandfather   . Cancer Son     History  Substance Use Topics  . Smoking status: Never Smoker   . Smokeless tobacco: Not on file  . Alcohol Use: No    Allergies: No Known Allergies  Prescriptions prior to admission  Medication Sig Dispense Refill  . prenatal vitamin w/FE, FA (PRENATAL 1 + 1) 27-1 MG TABS Take 1 tablet by mouth  daily.          Review of Systems  Constitutional: Negative.   HENT: Negative.   Eyes: Negative.   Respiratory: Negative.   Cardiovascular: Negative.   Gastrointestinal: Negative.   Genitourinary: Negative.   Musculoskeletal: Negative.   Skin: Negative.   Neurological: Negative.   Endo/Heme/Allergies: Negative.   Psychiatric/Behavioral: Negative.    Physical Exam   Blood pressure 109/60, pulse 94, temperature 98.4 F (36.9 C), temperature source Oral, resp. rate 22, height 5\' 5"  (1.651 m), weight 96.616 kg (213 lb), last menstrual period 02/09/2011.  Physical Exam  Constitutional: She is oriented to person, place, and time. She appears well-developed and well-nourished.  HENT:  Head: Normocephalic and atraumatic.  Right Ear: External ear normal.  Left Ear: External ear normal.  Nose: Nose normal.  Eyes: Conjunctivae are normal. Pupils are equal, round, and reactive to light.  Neck: Normal range of motion. Neck supple. No thyromegaly present.  Cardiovascular: Normal rate, regular rhythm and intact distal pulses.   Respiratory: Effort normal and breath sounds normal.  GI: Soft. Bowel sounds are normal.       Gravid, NT.    Genitourinary: Vagina normal and uterus normal.       Sm amt bloody mucus and clear discharge in vault  and on perineum.    Musculoskeletal: Normal range of motion.  Neurological: She is alert and oriented to person, place, and time. She has normal reflexes.  Skin: Skin is warm and dry.  Psychiatric: She has a normal mood and affect. Her behavior is normal. Judgment and thought content normal.   FHR baseline 130bpm.  Moderate variability present.  Accels present.  Occas variable decel to 120 x 10secs noted.   UCs every 3-67mins, moderate to palpation  SVE:  Dilation 1cm Effacement: 70% Station:  -3 Presentation:  Vtx Posterior with BBOW.  MAU Course  Procedures Fern-negative Amnisure-pending  Assessment and Plan  IUP at 41.0  wks Contractions  Await Amnisure results Pt to ambulate x 1 hr then re-eval SVE.  Haide Klinker O. 11/23/2011, 6:46 AM

## 2011-11-23 NOTE — Anesthesia Preprocedure Evaluation (Addendum)
Anesthesia Evaluation  Patient identified by MRN, date of birth, ID band Patient awake    Reviewed: Allergy & Precautions, H&P , NPO status , Patient's Chart, lab work & pertinent test results  Airway Mallampati: II TM Distance: >3 FB Neck ROM: full    Dental No notable dental hx.    Pulmonary neg pulmonary ROS,    Pulmonary exam normal       Cardiovascular neg cardio ROS     Neuro/Psych Negative Neurological ROS  Negative Psych ROS   GI/Hepatic negative GI ROS, Neg liver ROS,   Endo/Other  Morbid obesity  Renal/GU negative Renal ROS  Genitourinary negative   Musculoskeletal negative musculoskeletal ROS (+)   Abdominal (+) obese,   Peds negative pediatric ROS (+)  Hematology negative hematology ROS (+)   Anesthesia Other Findings   Reproductive/Obstetrics (+) Pregnancy                           Anesthesia Physical Anesthesia Plan  ASA: III  Anesthesia Plan: Epidural   Post-op Pain Management:    Induction:   Airway Management Planned:   Additional Equipment:   Intra-op Plan:   Post-operative Plan:   Informed Consent: I have reviewed the patients History and Physical, chart, labs and discussed the procedure including the risks, benefits and alternatives for the proposed anesthesia with the patient or authorized representative who has indicated his/her understanding and acceptance.     Plan Discussed with:   Anesthesia Plan Comments:         Anesthesia Quick Evaluation  

## 2011-11-23 NOTE — Progress Notes (Signed)
Patient ID: KEIRA BOHLIN, female   DOB: [birth year], 25 y.o.   MRN: [id number] .Subjective: Has now received 2 doses of stadol, feels good relief, drowsy   Objective: BP 128/68  Pulse 100  Temp(Src) 98.6 F (37 C) (Oral)  Resp 20  Ht 5\' 5"  (1.651 m)  Wt 96.616 kg (213 lb)  BMI 35.44 kg/m2  LMP 02/09/2011   FHT:  FHR: 130 bpm, variability: moderate,  accelerations:   ,  decelerations:  Present early mild variables UC:   regular, every 3-5 minutes, still with some coupling SVE:   Dilation: 3.5 Effacement (%): 100 Station: -1 Exam by:: shelly Karman Veney, cnm Difficult to feel sutures, suspect OP position  Pitocin at 17mu  Assessment / Plan: early/active labor Minimal cervical change after AROM GBS pos  Fetal Wellbeing:  Category II Pain Control:  stadol IUPC placed  Will start amnioinfusion Continue with pitocin for adequate MVU  Dr Stefano Gaul updated   Malissa Hippo 11/23/2011, 7:20 PM

## 2011-11-23 NOTE — Progress Notes (Signed)
G1 at 41wks. Ctxs since 12mn

## 2011-11-23 NOTE — Transfer of Care (Signed)
Immediate Anesthesia Transfer of Care Note  Patient: Tasha Martin  Procedure(s) Performed:  CESAREAN SECTION - primary cesarean section of baby girl at 2248  APGAR  9/9  Patient Location: PACU  Anesthesia Type: Epidural  Level of Consciousness: awake, alert  and oriented  Airway & Oxygen Therapy: Patient Spontanous Breathing  Post-op Assessment: Report given to PACU RN and Post -op Vital signs reviewed and stable  Post vital signs: stable  Complications: No apparent anesthesia complications

## 2011-11-23 NOTE — Progress Notes (Signed)
Patient ID: Tasha Martin, female   DOB: 1986-11-22, 25 y.o.   MRN: [id number] .Subjective:  Getting comfortable with epidural now  Objective: BP 111/60  Pulse 119  Temp(Src) 98.1 F (36.7 C) (Oral)  Resp 18  Ht 5\' 5"  (1.651 m)  Wt 96.616 kg (213 lb)  BMI 35.44 kg/m2  LMP 02/09/2011   FHT:  FHR: 140 bpm, variability: minimal ,  accelerations:  Present,  decelerations:  Present pattern of late decels vs. variables.  UC:   irregular, every 3-6 minutes, dysfunctional, coupling/tripling at times.  SVE:   Dilation: 3.5 Effacement (%): 100 Station: -1 Exam by:: shelly Mila Pair, cnm  Pitocin at 17mu  Assessment / Plan: early labor, minimal progress GBS pos   Fetal Wellbeing:  Category II Pain Control:  Epidural Amnioinfusion continues Discussed findings with patient, including may need c/s secondary to FHR pattern and lack of progress Will CTO closely, facilitate position changes to rotate baby, continue with pitocin augmentation and amnioinfusion   Update physician PRN  Malissa Hippo 11/23/2011, 9:28 PM

## 2011-11-24 ENCOUNTER — Encounter (HOSPITAL_COMMUNITY): Payer: Self-pay | Admitting: *Deleted

## 2011-11-24 LAB — KLEIHAUER-BETKE STAIN: # Vials RhIg: 1

## 2011-11-24 LAB — CBC
HCT: 31.1 % — ABNORMAL LOW (ref 36.0–46.0)
MCH: 30.8 pg (ref 26.0–34.0)
MCV: 94.8 fL (ref 78.0–100.0)
Platelets: 219 10*3/uL (ref 150–400)
RDW: 15.4 % (ref 11.5–15.5)

## 2011-11-24 MED ORDER — DIPHENHYDRAMINE HCL 25 MG PO CAPS: 25.0000 mg | ORAL_CAPSULE | Freq: Four times a day (QID) | ORAL | Status: DC | PRN

## 2011-11-24 MED ORDER — SENNOSIDES-DOCUSATE SODIUM 8.6-50 MG PO TABS
2.0000 | ORAL_TABLET | Freq: Every day | ORAL | Status: DC
  Administered 2011-11-25 (×2): 2 via ORAL

## 2011-11-24 MED ORDER — CEFAZOLIN SODIUM-DEXTROSE 2-3 GM-% IV SOLR: 2.0000 g | INTRAVENOUS | Status: DC

## 2011-11-24 MED ORDER — SCOPOLAMINE 1 MG/3DAYS TD PT72
MEDICATED_PATCH | TRANSDERMAL | Status: AC
  Filled 2011-11-24: qty 1

## 2011-11-24 MED ORDER — RHO D IMMUNE GLOBULIN 1500 UNIT/2ML IJ SOLN
300.0000 ug | Freq: Once | INTRAMUSCULAR | Status: AC
  Administered 2011-11-24: 300 ug via INTRAMUSCULAR
  Filled 2011-11-24: qty 2

## 2011-11-24 MED ORDER — PRENATAL PLUS 27-1 MG PO TABS
1.0000 | ORAL_TABLET | Freq: Every day | ORAL | Status: DC
  Administered 2011-11-24 – 2011-11-26 (×3): 1 via ORAL
  Filled 2011-11-24 (×3): qty 1

## 2011-11-24 MED ORDER — KETOROLAC TROMETHAMINE 60 MG/2ML IM SOLN
INTRAMUSCULAR | Status: AC
  Administered 2011-11-24: 60 mg via INTRAMUSCULAR
  Filled 2011-11-24: qty 2

## 2011-11-24 MED ORDER — KETOROLAC TROMETHAMINE 30 MG/ML IJ SOLN: 30.0000 mg | Freq: Four times a day (QID) | INTRAMUSCULAR | Status: AC | PRN

## 2011-11-24 MED ORDER — MEPERIDINE HCL 25 MG/ML IJ SOLN: 6.2500 mg | INTRAMUSCULAR | Status: DC | PRN

## 2011-11-24 MED ORDER — KETOROLAC TROMETHAMINE 60 MG/2ML IM SOLN
60.0000 mg | Freq: Once | INTRAMUSCULAR | Status: AC | PRN
  Administered 2011-11-24: 60 mg via INTRAMUSCULAR

## 2011-11-24 MED ORDER — SODIUM CHLORIDE 0.9 % IJ SOLN: 3.0000 mL | INTRAMUSCULAR | Status: DC | PRN

## 2011-11-24 MED ORDER — NALBUPHINE SYRINGE 5 MG/0.5 ML
5.0000 mg | INJECTION | INTRAMUSCULAR | Status: DC | PRN
Start: 2011-11-24 — End: 2011-11-26
  Filled 2011-11-24: qty 1

## 2011-11-24 MED ORDER — DIPHENHYDRAMINE HCL 50 MG/ML IJ SOLN: 25.0000 mg | INTRAMUSCULAR | Status: DC | PRN

## 2011-11-24 MED ORDER — MEDROXYPROGESTERONE ACETATE 150 MG/ML IM SUSP: 150.0000 mg | INTRAMUSCULAR | Status: DC | PRN

## 2011-11-24 MED ORDER — DIPHENHYDRAMINE HCL 25 MG PO CAPS: 25.0000 mg | ORAL_CAPSULE | ORAL | Status: DC | PRN

## 2011-11-24 MED ORDER — NALOXONE HCL 0.4 MG/ML IJ SOLN: 0.4000 mg | INTRAMUSCULAR | Status: DC | PRN

## 2011-11-24 MED ORDER — DIPHENHYDRAMINE HCL 50 MG/ML IJ SOLN
12.5000 mg | INTRAMUSCULAR | Status: DC | PRN
  Administered 2011-11-24: 12.5 mg via INTRAVENOUS
  Filled 2011-11-24: qty 1

## 2011-11-24 MED ORDER — IBUPROFEN 600 MG PO TABS: 600.0000 mg | ORAL_TABLET | Freq: Four times a day (QID) | ORAL | Status: DC | PRN

## 2011-11-24 MED ORDER — HYDROMORPHONE HCL PF 1 MG/ML IJ SOLN: 0.2500 mg | INTRAMUSCULAR | Status: DC | PRN

## 2011-11-24 MED ORDER — ONDANSETRON HCL 4 MG/2ML IJ SOLN: 4.0000 mg | INTRAMUSCULAR | Status: DC | PRN

## 2011-11-24 MED ORDER — OXYCODONE-ACETAMINOPHEN 5-325 MG PO TABS: 1.0000 | ORAL_TABLET | ORAL | Status: DC | PRN

## 2011-11-24 MED ORDER — IBUPROFEN 600 MG PO TABS
600.0000 mg | ORAL_TABLET | Freq: Four times a day (QID) | ORAL | Status: DC
  Administered 2011-11-24 – 2011-11-26 (×9): 600 mg via ORAL
  Filled 2011-11-24 (×9): qty 1

## 2011-11-24 MED ORDER — ONDANSETRON HCL 4 MG/2ML IJ SOLN: 4.0000 mg | Freq: Three times a day (TID) | INTRAMUSCULAR | Status: DC | PRN

## 2011-11-24 MED ORDER — WITCH HAZEL-GLYCERIN EX PADS: 1.0000 "application " | MEDICATED_PAD | CUTANEOUS | Status: DC | PRN

## 2011-11-24 MED ORDER — PROMETHAZINE HCL 25 MG/ML IJ SOLN: 6.2500 mg | INTRAMUSCULAR | Status: DC | PRN

## 2011-11-24 MED ORDER — SIMETHICONE 80 MG PO CHEW
80.0000 mg | CHEWABLE_TABLET | Freq: Three times a day (TID) | ORAL | Status: DC
  Administered 2011-11-24 – 2011-11-26 (×7): 80 mg via ORAL

## 2011-11-24 MED ORDER — NALBUPHINE SYRINGE 5 MG/0.5 ML
5.0000 mg | INJECTION | INTRAMUSCULAR | Status: DC | PRN
  Filled 2011-11-24: qty 1

## 2011-11-24 MED ORDER — SCOPOLAMINE 1 MG/3DAYS TD PT72
1.0000 | MEDICATED_PATCH | Freq: Once | TRANSDERMAL | Status: DC
  Administered 2011-11-24: 1.5 mg via TRANSDERMAL

## 2011-11-24 MED ORDER — LACTATED RINGERS IV SOLN
INTRAVENOUS | Status: DC
  Administered 2011-11-24: 03:00:00 via INTRAVENOUS

## 2011-11-24 MED ORDER — OXYTOCIN 20 UNITS IN LACTATED RINGERS INFUSION - SIMPLE: 125.0000 mL/h | INTRAVENOUS | Status: AC

## 2011-11-24 MED ORDER — MENTHOL 3 MG MT LOZG: 1.0000 | LOZENGE | OROMUCOSAL | Status: DC | PRN

## 2011-11-24 MED ORDER — TETANUS-DIPHTH-ACELL PERTUSSIS 5-2.5-18.5 LF-MCG/0.5 IM SUSP: 0.5000 mL | Freq: Once | INTRAMUSCULAR | Status: DC

## 2011-11-24 MED ORDER — SIMETHICONE 80 MG PO CHEW: 80.0000 mg | CHEWABLE_TABLET | ORAL | Status: DC | PRN

## 2011-11-24 MED ORDER — MEASLES, MUMPS & RUBELLA VAC ~~LOC~~ INJ
0.5000 mL | INJECTION | Freq: Once | SUBCUTANEOUS | Status: DC
  Filled 2011-11-24: qty 0.5

## 2011-11-24 MED ORDER — SODIUM CHLORIDE 0.9 % IV SOLN
1.0000 ug/kg/h | INTRAVENOUS | Status: DC | PRN
  Filled 2011-11-24: qty 2.5

## 2011-11-24 MED ORDER — DIBUCAINE 1 % RE OINT: 1.0000 "application " | TOPICAL_OINTMENT | RECTAL | Status: DC | PRN

## 2011-11-24 MED ORDER — LANOLIN HYDROUS EX OINT: 1.0000 "application " | TOPICAL_OINTMENT | CUTANEOUS | Status: DC | PRN

## 2011-11-24 MED ORDER — ZOLPIDEM TARTRATE 5 MG PO TABS: 5.0000 mg | ORAL_TABLET | Freq: Every evening | ORAL | Status: DC | PRN

## 2011-11-24 MED ORDER — KETOROLAC TROMETHAMINE 30 MG/ML IJ SOLN: 15.0000 mg | Freq: Once | INTRAMUSCULAR | Status: DC | PRN

## 2011-11-24 MED ORDER — ONDANSETRON HCL 4 MG PO TABS: 4.0000 mg | ORAL_TABLET | ORAL | Status: DC | PRN

## 2011-11-24 NOTE — Progress Notes (Signed)
Subjective: Postpartum Day 1: Cesarean Delivery for non-reassuring FHR Patient reports stood at bedside, foley still in place.  Mild pain, using po meds with benefit.  Working on breastfeeding.  Undecided regarding contraception.  + flatus.  Objective: Vital signs in last 24 hours: Temp:  [97.7 F (36.5 C)-100 F (37.8 C)] 98.3 F (36.8 C) (12/17 0650) Pulse Rate:  [25-127] 92  (12/17 0650) Resp:  [16-22] 18  (12/17 0650) BP: (97-129)/(46-84) 102/67 mmHg (12/17 0650) SpO2:  [94 %-98 %] 96 % (12/17 0650) Weight:  [96.616 kg (213 lb)] 213 lb (96.616 kg) (12/16 0909)  Physical Exam:  General: alert and cooperative Lochia: appropriate Uterine Fundus: firm Incision: Dressing CDI DVT Evaluation: No evidence of DVT seen on physical exam. Negative Homan's sign. Foley draining clear urine  Basename 11/24/11 0525 11/23/11 0835  HGB 10.1* 11.7*  HCT 31.1* 35.5*    Assessment/Plan: Status post Cesarean section. Doing well postoperatively.  Continue current care.  Asli Tokarski 11/24/2011, 8:14 AM

## 2011-11-24 NOTE — Anesthesia Postprocedure Evaluation (Signed)
  Anesthesia Post-op Note  Patient: Tasha Martin  Procedure(s) Performed:  CESAREAN SECTION - primary cesarean section of baby girl at 2248  APGAR  9/9  Patient Location: Mother/Baby  Anesthesia Type: Epidural  Level of Consciousness: awake, alert  and oriented  Airway and Oxygen Therapy: Patient Spontanous Breathing  Post-op Pain: none  Post-op Assessment: Post-op Vital signs reviewed, Patient's Cardiovascular Status Stable, No headache, No backache, No residual numbness and No residual motor weakness  Post-op Vital Signs: Reviewed and stable  Complications: No apparent anesthesia complications

## 2011-11-24 NOTE — Addendum Note (Signed)
Addendum  created 11/24/11 0805 by Madison Hickman   Modules edited:Notes Section

## 2011-11-24 NOTE — Progress Notes (Signed)
Rhogam given in pt's left quad. Location changed per pt after being verified by second RN in computer.

## 2011-11-25 ENCOUNTER — Encounter (HOSPITAL_COMMUNITY): Payer: Self-pay | Admitting: Obstetrics and Gynecology

## 2011-11-25 LAB — RH IG WORKUP (INCLUDES ABO/RH): Unit division: 0

## 2011-11-25 NOTE — Progress Notes (Signed)
Patient ID: Tasha Martin, female   DOB: 1985/12/12, 25 y.o.   MRN: [id number]

## 2011-11-25 NOTE — Progress Notes (Addendum)
S: comfortable with pain meds, passing some gas, no N,V, breastfeeding well, walking,  Considering mirena IUD O VSS      HGB 10.1 PE:  Lungs clear bilaterally on ausculatation, AP RRR, abd slightly distended, ff1 below U, small serosa flow         +1 edema lower legs and feet, - Homans sign bilaterally A PO C/S Day 2 P encouraged ambulation, IUD discussed, plan discharge home tomorrow. Lavera Guise, CNM  Agree with above - AYR

## 2011-11-26 ENCOUNTER — Inpatient Hospital Stay (HOSPITAL_COMMUNITY): Admission: RE | Admit: 2011-11-26 | Payer: No Typology Code available for payment source | Source: Ambulatory Visit

## 2011-11-26 DIAGNOSIS — Z98891 History of uterine scar from previous surgery: Secondary | ICD-10-CM

## 2011-11-26 MED ORDER — IBUPROFEN 600 MG PO TABS: 600.0000 mg | ORAL_TABLET | Freq: Four times a day (QID) | ORAL | Status: AC

## 2011-11-26 NOTE — Discharge Summary (Signed)
Physician Discharge Summary  Patient ID: Tasha Martin MRN: [id number] DOB/AGE: 01-Aug-1986 25 y.o.   Admit date: 11/23/2011 Discharge date: 11/26/2011  Admission Diagnoses:labor  Discharge Diagnoses:  Active Problems:  Hx of abnormal cervical Pap smear had cryo in 2011  Status post cesarean section   Discharged Condition: stable  Hospital Course: presented with contractions, admission 1.5 cm, amnioinfusion, fhr with prolonged decelerations, failure to progress beyond 4 cm for cesarean section. Relief with po pain medication.  Pt declined narcotic analgesics during her hospitalization, and declined prescription for same on discharge.  Consults: none  Significant Diagnostic Studies:  Treatments:   Discharge Exam: Blood pressure 112/71, pulse 86, temperature 99 F (37.2 C), temperature source Oral, resp. rate 18, height 5\' 5"  (1.651 m), weight 96.616 kg (213 lb), last menstrual period 02/09/2011, SpO2 98.00%, unknown if currently breastfeeding. Lungs clear bilaterally on auscultation, AP RRR, abd soft, nt, ff, small serosa flow, incision well approximated without edema, redness, or drainage, - Homans sign bilaterally, trace edema lower legs.  Disposition: Home or Self Care  Discharge Orders    Future Appointments: Provider: Department: Dept Phone: Center:   12/03/2011 10:30 AM [facility] [phone] None     Future Orders Please Complete By Expires   Contraindication to ACEI at discharge        Medication List  As of 11/26/2011  9:31 AM   START taking these medications         ibuprofen 600 MG tablet   Commonly known as: ADVIL,MOTRIN   Take 1 tablet (600 mg total) by mouth every 6 (six) hours.         CONTINUE taking these medications         prenatal vitamin w/FE, FA 27-1 MG Tabs          Where to get your medications    These are the prescriptions that you need to pick up.   You may get these medications from any pharmacy.         ibuprofen 600 MG tablet           Follow-up Information    Follow up with [facility] in 5 weeks.         SignedLavera Guise 11/26/2011, 9:31 AM

## 2011-12-03 ENCOUNTER — Encounter (HOSPITAL_COMMUNITY): Payer: No Typology Code available for payment source

## 2012-02-18 ENCOUNTER — Encounter (INDEPENDENT_AMBULATORY_CARE_PROVIDER_SITE_OTHER): Payer: 59 | Admitting: Registered Nurse

## 2012-02-18 DIAGNOSIS — B373 Candidiasis of vulva and vagina: Secondary | ICD-10-CM

## 2012-03-03 ENCOUNTER — Encounter (INDEPENDENT_AMBULATORY_CARE_PROVIDER_SITE_OTHER): Payer: 59 | Admitting: Registered Nurse

## 2012-03-03 ENCOUNTER — Other Ambulatory Visit (INDEPENDENT_AMBULATORY_CARE_PROVIDER_SITE_OTHER): Payer: 59

## 2012-03-03 DIAGNOSIS — Z3040 Encounter for surveillance of contraceptives, unspecified: Secondary | ICD-10-CM

## 2012-03-03 DIAGNOSIS — K649 Unspecified hemorrhoids: Secondary | ICD-10-CM

## 2012-03-03 DIAGNOSIS — Z3009 Encounter for other general counseling and advice on contraception: Secondary | ICD-10-CM

## 2012-03-19 ENCOUNTER — Other Ambulatory Visit: Payer: 59
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
## 2012-04-29 ENCOUNTER — Encounter: Payer: Self-pay | Admitting: Obstetrics and Gynecology

## 2012-04-29 ENCOUNTER — Ambulatory Visit (INDEPENDENT_AMBULATORY_CARE_PROVIDER_SITE_OTHER): Payer: 59 | Admitting: Obstetrics and Gynecology

## 2012-04-29 VITALS — BP 100/70 | HR 72 | Ht 65.0 in | Wt 171.0 lb

## 2012-04-29 DIAGNOSIS — Z113 Encounter for screening for infections with a predominantly sexual mode of transmission: Secondary | ICD-10-CM

## 2012-04-29 DIAGNOSIS — N760 Acute vaginitis: Secondary | ICD-10-CM

## 2012-04-29 DIAGNOSIS — B9689 Other specified bacterial agents as the cause of diseases classified elsewhere: Secondary | ICD-10-CM

## 2012-04-29 DIAGNOSIS — N9089 Other specified noninflammatory disorders of vulva and perineum: Secondary | ICD-10-CM

## 2012-04-29 DIAGNOSIS — N898 Other specified noninflammatory disorders of vagina: Secondary | ICD-10-CM

## 2012-04-29 DIAGNOSIS — A499 Bacterial infection, unspecified: Secondary | ICD-10-CM

## 2012-04-29 MED ORDER — METRONIDAZOLE 0.75 % VA GEL: VAGINAL | Status: DC

## 2012-04-29 NOTE — Progress Notes (Signed)
25 YO complains of malodorous vaginal discharge and white bumps on outside of vagina x 1 week. Bumps burn when urine touches the area.  Denies urinary tract symptoms, change in bowel movements,  or fever.  O Wet Prep: pH 5.0, whiff-negative, clue +  Pelvic: EGBUS-medial left labia minora with  2 tender erosions, tender inguinal areas without nodularity,  vagina-thin grey discharge, cervix-no lesions, uterus-normal size, adnexae-no masses  tender inguinal area  A: Bacterial Vaginosis    Vulvar lesion- R/O HSV  P: HSV culture, GC/CT pending     Metrogel Vaginal # 1 tube 1 applicatorful pv qd x 5 days   \Nicholaus Steinke, PA-C

## 2012-04-29 NOTE — Progress Notes (Signed)
Odor: yes Fever: no Pelvic Pain: no  Itching: no  Dyspareunia: no Desires GC/CT: yes  Thin: yes History of PID: no Desires HIV,RPR,HbsAG: yes  Thick: no History of STD: yes CT Other: CLEAR DISCHARGE WITH ODOR AND BURNING IN AREA WHERE  BUMPS ARE; USED ANTI FUNGAL CREAM IN THE AREA

## 2012-05-25 ENCOUNTER — Other Ambulatory Visit (INDEPENDENT_AMBULATORY_CARE_PROVIDER_SITE_OTHER): Payer: 59

## 2012-05-25 DIAGNOSIS — Z3009 Encounter for other general counseling and advice on contraception: Secondary | ICD-10-CM

## 2012-05-25 MED ORDER — MEDROXYPROGESTERONE ACETATE 150 MG/ML IM SUSP
150.0000 mg | Freq: Once | INTRAMUSCULAR | Status: AC
  Administered 2012-05-25: 150 mg via INTRAMUSCULAR

## 2012-05-25 NOTE — Progress Notes (Unsigned)
Next Depo due-08-16-2012 

## 2012-08-16 ENCOUNTER — Other Ambulatory Visit: Payer: 59

## 2012-08-18 ENCOUNTER — Other Ambulatory Visit (INDEPENDENT_AMBULATORY_CARE_PROVIDER_SITE_OTHER): Payer: BC Managed Care – PPO

## 2012-08-18 ENCOUNTER — Telehealth: Payer: Self-pay | Admitting: Obstetrics and Gynecology

## 2012-08-18 DIAGNOSIS — Z3009 Encounter for other general counseling and advice on contraception: Secondary | ICD-10-CM

## 2012-08-18 MED ORDER — MEDROXYPROGESTERONE ACETATE 150 MG/ML IM SUSP: 150.0000 mg | INTRAMUSCULAR | Status: DC

## 2012-08-18 MED ORDER — MEDROXYPROGESTERONE ACETATE 150 MG/ML IM SUSP
150.0000 mg | Freq: Once | INTRAMUSCULAR | Status: AC
  Administered 2012-08-18: 150 mg via INTRAMUSCULAR

## 2012-08-18 NOTE — Progress Notes (Unsigned)
Next Depo due-11-09-2012

## 2012-08-18 NOTE — Telephone Encounter (Signed)
TC TO PT REGARDING DEPO. PT WANT [place] TO SEND IN RX TO [facility] IN [place] FOR INJECTION TODAY. INFORMED PT THAT WE WILL.

## 2012-08-18 NOTE — Telephone Encounter (Signed)
Triage/rx req for depo/appt today

## 2012-08-31 ENCOUNTER — Encounter: Payer: BC Managed Care – PPO | Admitting: Obstetrics and Gynecology

## 2012-08-31 DIAGNOSIS — Z0000 Encounter for general adult medical examination without abnormal findings: Secondary | ICD-10-CM

## 2012-10-16 ENCOUNTER — Other Ambulatory Visit: Payer: Self-pay | Admitting: Obstetrics and Gynecology

## 2012-11-09 ENCOUNTER — Other Ambulatory Visit: Payer: BC Managed Care – PPO

## 2012-12-06 ENCOUNTER — Telehealth: Payer: Self-pay | Admitting: Obstetrics and Gynecology

## 2012-12-06 NOTE — Telephone Encounter (Signed)
Spoke with pt rgd msg pt missed depo provera inj on 11/10/12 advised pt can restart if two weeks no intercourse and neg upt pt has appt 12/16/11 at 9;15 pt voice understanding

## 2012-12-13 ENCOUNTER — Telehealth: Payer: Self-pay | Admitting: Obstetrics and Gynecology

## 2012-12-13 NOTE — Telephone Encounter (Signed)
Pt req hcange of appt to 12-24-12. ld

## 2012-12-15 ENCOUNTER — Other Ambulatory Visit: Payer: BC Managed Care – PPO

## 2012-12-24 ENCOUNTER — Other Ambulatory Visit: Payer: BC Managed Care – PPO

## 2012-12-24 DIAGNOSIS — Z3009 Encounter for other general counseling and advice on contraception: Secondary | ICD-10-CM

## 2012-12-24 MED ORDER — MEDROXYPROGESTERONE ACETATE 150 MG/ML IM SUSP
150.0000 mg | Freq: Once | INTRAMUSCULAR | Status: AC
  Administered 2012-12-24: 150 mg via INTRAMUSCULAR

## 2012-12-24 NOTE — Progress Notes (Unsigned)
Pt denies having unprotected intercourse.  UPT-Neg.  Next Depo due 03-17-2013

## 2014-10-09 ENCOUNTER — Encounter: Payer: Self-pay | Admitting: Obstetrics and Gynecology

## 2018-12-16 ENCOUNTER — Other Ambulatory Visit: Payer: Self-pay | Admitting: Obstetrics and Gynecology
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
## 2018-12-17 ENCOUNTER — Other Ambulatory Visit: Payer: Self-pay

## 2018-12-17 ENCOUNTER — Ambulatory Visit (HOSPITAL_COMMUNITY): Payer: 59 | Admitting: Anesthesiology

## 2018-12-17 ENCOUNTER — Encounter (HOSPITAL_COMMUNITY)
Admission: AD | Disposition: A | Discharge status: Home / Self Care | Payer: Self-pay | Source: Home / Self Care | Attending: Obstetrics and Gynecology

## 2018-12-17 ENCOUNTER — Ambulatory Visit (HOSPITAL_COMMUNITY)
Admission: AD | Admit: 2018-12-17 | Discharge: 2018-12-17 | Disposition: A | Discharge status: Home / Self Care | Payer: 59 | Attending: Obstetrics and Gynecology | Admitting: Obstetrics and Gynecology

## 2018-12-17 ENCOUNTER — Ambulatory Visit (HOSPITAL_COMMUNITY): Payer: 59

## 2018-12-17 ENCOUNTER — Encounter (HOSPITAL_COMMUNITY): Payer: Self-pay

## 2018-12-17 DIAGNOSIS — Z8249 Family history of ischemic heart disease and other diseases of the circulatory system: Secondary | ICD-10-CM | POA: Insufficient documentation

## 2018-12-17 DIAGNOSIS — Z3A01 Less than 8 weeks gestation of pregnancy: Secondary | ICD-10-CM | POA: Diagnosis not present

## 2018-12-17 DIAGNOSIS — R58 Hemorrhage, not elsewhere classified: Secondary | ICD-10-CM

## 2018-12-17 DIAGNOSIS — O021 Missed abortion: Secondary | ICD-10-CM | POA: Diagnosis present

## 2018-12-17 DIAGNOSIS — Z87440 Personal history of urinary (tract) infections: Secondary | ICD-10-CM | POA: Insufficient documentation

## 2018-12-17 HISTORY — PX: DILATION AND EVACUATION: SHX1459

## 2018-12-17 LAB — CBC
HCT: 41.9 % (ref 36.0–46.0)
Hemoglobin: 13.9 g/dL (ref 12.0–15.0)
MCH: 32.3 pg (ref 26.0–34.0)
MCHC: 33.2 g/dL (ref 30.0–36.0)
MCV: 97.2 fL (ref 80.0–100.0)
Platelets: 284 10*3/uL (ref 150–400)
RBC: 4.31 MIL/uL (ref 3.87–5.11)
RDW: 12.2 % (ref 11.5–15.5)
WBC: 4.9 10*3/uL (ref 4.0–10.5)
nRBC: 0 % (ref 0.0–0.2)

## 2018-12-17 LAB — TYPE AND SCREEN
ABO/RH(D): O NEG
ANTIBODY SCREEN: NEGATIVE

## 2018-12-17 SURGERY — DILATION AND EVACUATION, UTERUS
Anesthesia: General | Site: Uterus

## 2018-12-17 MED ORDER — ACETAMINOPHEN 160 MG/5ML PO SOLN: 325.0000 mg | ORAL | Status: DC | PRN

## 2018-12-17 MED ORDER — LIDOCAINE HCL (CARDIAC) PF 100 MG/5ML IV SOSY
PREFILLED_SYRINGE | INTRAVENOUS | Status: AC
  Filled 2018-12-17: qty 5

## 2018-12-17 MED ORDER — DEXAMETHASONE SODIUM PHOSPHATE 4 MG/ML IJ SOLN
INTRAMUSCULAR | Status: AC
  Filled 2018-12-17: qty 1

## 2018-12-17 MED ORDER — ONDANSETRON HCL 4 MG/2ML IJ SOLN
INTRAMUSCULAR | Status: AC
  Filled 2018-12-17: qty 2

## 2018-12-17 MED ORDER — ACETAMINOPHEN 325 MG PO TABS: 325.0000 mg | ORAL_TABLET | ORAL | Status: DC | PRN

## 2018-12-17 MED ORDER — LACTATED RINGERS IV SOLN
INTRAVENOUS | Status: DC
  Administered 2018-12-17: 11:00:00 via INTRAVENOUS

## 2018-12-17 MED ORDER — FENTANYL CITRATE (PF) 100 MCG/2ML IJ SOLN
INTRAMUSCULAR | Status: DC | PRN
  Administered 2018-12-17 (×2): 50 ug via INTRAVENOUS

## 2018-12-17 MED ORDER — MIDAZOLAM HCL 2 MG/2ML IJ SOLN
INTRAMUSCULAR | Status: DC | PRN
  Administered 2018-12-17: 2 mg via INTRAVENOUS

## 2018-12-17 MED ORDER — FENTANYL CITRATE (PF) 100 MCG/2ML IJ SOLN
INTRAMUSCULAR | Status: AC
  Filled 2018-12-17: qty 2

## 2018-12-17 MED ORDER — SUGAMMADEX SODIUM 200 MG/2ML IV SOLN
INTRAVENOUS | Status: AC
  Filled 2018-12-17: qty 2

## 2018-12-17 MED ORDER — ONDANSETRON HCL 4 MG/2ML IJ SOLN: 4.0000 mg | Freq: Once | INTRAMUSCULAR | Status: DC | PRN

## 2018-12-17 MED ORDER — SUGAMMADEX SODIUM 500 MG/5ML IV SOLN
INTRAVENOUS | Status: AC
  Filled 2018-12-17: qty 5

## 2018-12-17 MED ORDER — FENTANYL CITRATE (PF) 100 MCG/2ML IJ SOLN: 25.0000 ug | INTRAMUSCULAR | Status: DC | PRN

## 2018-12-17 MED ORDER — KETOROLAC TROMETHAMINE 30 MG/ML IJ SOLN
INTRAMUSCULAR | Status: AC
  Filled 2018-12-17: qty 1

## 2018-12-17 MED ORDER — MEPERIDINE HCL 25 MG/ML IJ SOLN: 6.2500 mg | INTRAMUSCULAR | Status: DC | PRN

## 2018-12-17 MED ORDER — 0.9 % SODIUM CHLORIDE (POUR BTL) OPTIME
TOPICAL | Status: DC | PRN
  Administered 2018-12-17: 1000 mL

## 2018-12-17 MED ORDER — GLYCOPYRROLATE 0.2 MG/ML IJ SOLN
INTRAMUSCULAR | Status: DC | PRN
  Administered 2018-12-17: 0.1 mg via INTRAVENOUS

## 2018-12-17 MED ORDER — LIDOCAINE HCL (CARDIAC) PF 100 MG/5ML IV SOSY
PREFILLED_SYRINGE | INTRAVENOUS | Status: DC | PRN
  Administered 2018-12-17: 40 mg via INTRAVENOUS

## 2018-12-17 MED ORDER — DEXAMETHASONE SODIUM PHOSPHATE 10 MG/ML IJ SOLN
INTRAMUSCULAR | Status: DC | PRN
  Administered 2018-12-17: 4 mg via INTRAVENOUS

## 2018-12-17 MED ORDER — SCOPOLAMINE 1 MG/3DAYS TD PT72
1.0000 | MEDICATED_PATCH | Freq: Once | TRANSDERMAL | Status: DC
  Administered 2018-12-17: 1.5 mg via TRANSDERMAL

## 2018-12-17 MED ORDER — IBUPROFEN 600 MG PO TABS: 600.0000 mg | ORAL_TABLET | Freq: Four times a day (QID) | ORAL | 0 refills | Status: DC | PRN

## 2018-12-17 MED ORDER — OXYCODONE HCL 5 MG/5ML PO SOLN: 5.0000 mg | Freq: Once | ORAL | Status: DC | PRN

## 2018-12-17 MED ORDER — GLYCOPYRROLATE 0.2 MG/ML IJ SOLN
INTRAMUSCULAR | Status: AC
  Filled 2018-12-17: qty 1

## 2018-12-17 MED ORDER — PROPOFOL 10 MG/ML IV BOLUS
INTRAVENOUS | Status: DC | PRN
  Administered 2018-12-17: 180 mg via INTRAVENOUS

## 2018-12-17 MED ORDER — SCOPOLAMINE 1 MG/3DAYS TD PT72
MEDICATED_PATCH | TRANSDERMAL | Status: AC
  Administered 2018-12-17: 1.5 mg via TRANSDERMAL
  Filled 2018-12-17: qty 1

## 2018-12-17 MED ORDER — PROPOFOL 10 MG/ML IV BOLUS
INTRAVENOUS | Status: AC
  Filled 2018-12-17: qty 40

## 2018-12-17 MED ORDER — KETOROLAC TROMETHAMINE 30 MG/ML IJ SOLN
INTRAMUSCULAR | Status: DC | PRN
  Administered 2018-12-17: 30 mg via INTRAVENOUS

## 2018-12-17 MED ORDER — ONDANSETRON HCL 4 MG/2ML IJ SOLN
INTRAMUSCULAR | Status: DC | PRN
  Administered 2018-12-17: 4 mg via INTRAVENOUS

## 2018-12-17 MED ORDER — OXYCODONE HCL 5 MG PO TABS: 5.0000 mg | ORAL_TABLET | Freq: Once | ORAL | Status: DC | PRN

## 2018-12-17 MED ORDER — LIDOCAINE HCL 2 % IJ SOLN
INTRAMUSCULAR | Status: DC | PRN
  Administered 2018-12-17: 20 mL

## 2018-12-17 MED ORDER — MIDAZOLAM HCL 2 MG/2ML IJ SOLN
INTRAMUSCULAR | Status: AC
  Filled 2018-12-17: qty 2

## 2018-12-17 SURGICAL SUPPLY — 21 items
CATH ROBINSON RED A/P 16FR (CATHETERS) ×3 IMPLANT
DECANTER SPIKE VIAL GLASS SM (MISCELLANEOUS) ×3 IMPLANT
DILATOR CANAL MILEX (MISCELLANEOUS) IMPLANT
GLOVE BIO SURGEON STRL SZ 6.5 (GLOVE) ×2 IMPLANT
GLOVE BIO SURGEONS STRL SZ 6.5 (GLOVE) ×1
GLOVE BIOGEL PI IND STRL 7.0 (GLOVE) ×2 IMPLANT
GLOVE BIOGEL PI INDICATOR 7.0 (GLOVE) ×4
GOWN STRL REUS W/TWL LRG LVL3 (GOWN DISPOSABLE) ×6 IMPLANT
HIBICLENS CHG 4% 4OZ BTL (MISCELLANEOUS) ×3 IMPLANT
KIT BERKELEY 1ST TRIMESTER 3/8 (MISCELLANEOUS) ×3 IMPLANT
NS IRRIG 1000ML POUR BTL (IV SOLUTION) ×3 IMPLANT
PACK VAGINAL MINOR WOMEN LF (CUSTOM PROCEDURE TRAY) ×3 IMPLANT
PAD OB MATERNITY 4.3X12.25 (PERSONAL CARE ITEMS) ×3 IMPLANT
PAD PREP 24X48 CUFFED NSTRL (MISCELLANEOUS) ×3 IMPLANT
SET BERKELEY SUCTION TUBING (SUCTIONS) ×3 IMPLANT
SYR 20CC LL (SYRINGE) ×3 IMPLANT
TOWEL OR 17X24 6PK STRL BLUE (TOWEL DISPOSABLE) ×6 IMPLANT
VACURETTE 10 RIGID CVD (CANNULA) IMPLANT
VACURETTE 7MM CVD STRL WRAP (CANNULA) ×2 IMPLANT
VACURETTE 8 RIGID CVD (CANNULA) IMPLANT
VACURETTE 9 RIGID CVD (CANNULA) IMPLANT

## 2018-12-17 NOTE — Discharge Instructions (Signed)
DISCHARGE INSTRUCTIONS: D&C / D&E The following instructions have been prepared to help you care for yourself upon your return home.   Personal hygiene:  Use sanitary pads for vaginal drainage, not tampons.  Shower the day after your procedure.  NO tub baths, pools or Jacuzzis for 2-3 weeks.  Wipe front to back after using the bathroom.  Activity and limitations:  Do NOT drive or operate any equipment for 24 hours. The effects of anesthesia are still present and drowsiness may result.  Do NOT rest in bed all day.  Walking is encouraged.  Walk up and down stairs slowly.  You may resume your normal activity in one to two days or as indicated by your physician.  Sexual activity: NO intercourse for at least 2 weeks after the procedure, or as indicated by your physician.  Diet: Eat a light meal as desired this evening. You may resume your usual diet tomorrow.  Return to work: You may resume your work activities in one to two days or as indicated by your doctor.  What to expect after your surgery: Expect to have vaginal bleeding/discharge for 2-3 days and spotting for up to 10 days. It is not unusual to have soreness for up to 1-2 weeks. You may have a slight burning sensation when you urinate for the first day. Mild cramps may continue for a couple of days. You may have a regular period in 2-6 weeks.  Call your doctor for any of the following:  Excessive vaginal bleeding, saturating and changing one pad every hour.  Inability to urinate 6 hours after discharge from hospital.  Pain not relieved by pain medication.  Fever of 100.4 F or greater.  Unusual vaginal discharge or odor.   Call for an appointment:    Patients signature: ______________________  Nurses signature ________________________  Support person's signature_______________________   Miscarriage A miscarriage is the loss of an unborn baby (fetus) before the 20th week of pregnancy. Follow these  instructions at home: Medicines   Take over-the-counter and prescription medicines only as told by your doctor.  If you were prescribed antibiotic medicine, take it as told by your doctor. Do not stop taking the antibiotic even if you start to feel better.  Do not take NSAIDs unless your doctor says that this is safe for you. NSAIDs include aspirin and ibuprofen. These medicines can cause bleeding. Activity  Rest as directed. Ask your doctor what activities are safe for you.  Have someone help you at home during this time. General instructions  Write down how many pads you use each day and how soaked they are.  Watch the amount of tissue or clumps of blood (blood clots) that you pass from your vagina. Save any large amounts of tissue for your doctor.  Do not use tampons, douche, or have sex until your doctor approves.  To help you and your partner with the process of grieving, talk with your doctor or seek counseling.  When you are ready, meet with your doctor to talk about steps you should take for your health. Also, talk with your doctor about steps to take to have a healthy pregnancy in the future.  Keep all follow-up visits as told by your doctor. This is important. Contact a doctor if:  You have a fever or chills.  You have vaginal discharge that smells bad.  You have more bleeding. Get help right away if:  You have very bad cramps or pain in your back or belly.  You  pass clumps of blood that are walnut-sized or larger from your vagina.  You pass tissue that is walnut-sized or larger from your vagina.  You soak more than 1 regular pad in an hour.  You get light-headed or weak.  You faint (pass out).  You have feelings of sadness that do not go away, or you have thoughts of hurting yourself. Summary  A miscarriage is the loss of an unborn baby before the 20th week of pregnancy.  Follow your doctor's instructions for home care. Keep all follow-up  appointments.  To help you and your partner with the process of grieving, talk with your doctor or seek counseling. This information is not intended to replace advice given to you by your health care provider. Make sure you discuss any questions you have with your health care provider. Document Released: 02/16/2012 Document Revised: 12/30/2016 Document Reviewed: 12/30/2016 Elsevier Interactive Patient Education  2019 [employer].

## 2018-12-17 NOTE — Op Note (Signed)
Pre op DX :  misssed abortion Postoperative Diagnosis: same Procedure:  dilation and evacuation Anesthesia:  MAC and local Surgeon:  Dr. Charlesetta Garibaldi Asst : none Complications : none Procedure in detail: The patient was taken to the operating room where she was given MAC anesthesia. She was placed in dorsal lithotomy position and prepped and draped in normal sterile fashion. In and out catheter was used to drain the bladder. This was examined and noted to have a 7 week size uterus with no adnexal masses. A bivalve was placed into the vagina. A tenaculum was placed on the cervix. The cervix was infiltrated with 20 cc 1% lidocaine paracervical block. The cervix then dilated with dilators up to 21.  A size 7 suction curettage was placed into the uterine cavity. A scant amount of products of conception was seen. The suction curettage was removed when a gritty texture was noted. A sharp curette was done along all walls  of the uterus. The suction curet was placed back into the uterine cavity. No further products of conception were obtained. Sponge lap and needle counts were correct. Patient back in stable condition. The patient understood to be the risks to be, but not  limited to,  Bleeding,  Infection,  damage to internal organs by perforation of the uterus and Asherman syndrome (scarring in the uterus) leading to infertility.  US guidance was used

## 2018-12-17 NOTE — Anesthesia Procedure Notes (Signed)
Procedure Name: LMA Insertion Date/Time: 12/17/2018 12:42 PM Performed by: Angela AdamWrinkle, Evie Crumpler G, CRNA Pre-anesthesia Checklist: Patient identified, Emergency Drugs available, Suction available and Patient being monitored Patient Re-evaluated:Patient Re-evaluated prior to induction Oxygen Delivery Method: Circle system utilized Preoxygenation: Pre-oxygenation with 100% oxygen Induction Type: IV induction LMA: LMA inserted Tube size: 7.0 mm Number of attempts: 1 Dental Injury: Teeth and Oropharynx as per pre-operative assessment

## 2018-12-17 NOTE — Transfer of Care (Signed)
Immediate Anesthesia Transfer of Care Note  Patient: Tasha Martin  Procedure(s) Performed: SUCTION DILATATION AND EVACUATION (N/A Uterus)  Patient Location: PACU  Anesthesia Type:General  Level of Consciousness: awake  Airway & Oxygen Therapy: Patient Spontanous Breathing and Patient connected to nasal cannula oxygen  Post-op Assessment: Report given to RN and Post -op Vital signs reviewed and stable  Post vital signs: Reviewed  Last Vitals:  Vitals Value Taken Time  BP    Temp    Pulse    Resp    SpO2      Last Pain:  Vitals:   12/17/18 1110  TempSrc: Oral  PainSc: 0-No pain      Patients Stated Pain Goal: 4 (12/17/18 1110)  Complications: No apparent anesthesia complications

## 2018-12-17 NOTE — Anesthesia Preprocedure Evaluation (Signed)
Anesthesia Evaluation  Patient identified by MRN, date of birth, ID band Patient awake    Reviewed: Allergy & Precautions, H&P , NPO status , Patient's Chart, lab work & pertinent test results, reviewed documented beta blocker date and time   Airway Mallampati: I  TM Distance: >3 FB Neck ROM: full    Dental no notable dental hx. (+) Teeth Intact   Pulmonary neg pulmonary ROS,    Pulmonary exam normal breath sounds clear to auscultation       Cardiovascular Exercise Tolerance: Good negative cardio ROS   Rhythm:regular Rate:Normal     Neuro/Psych negative neurological ROS  negative psych ROS   GI/Hepatic negative GI ROS, Neg liver ROS,   Endo/Other  negative endocrine ROS  Renal/GU negative Renal ROS  negative genitourinary   Musculoskeletal   Abdominal   Peds  Hematology negative hematology ROS (+)   Anesthesia Other Findings   Reproductive/Obstetrics negative OB ROS                             Anesthesia Physical Anesthesia Plan  ASA: II  Anesthesia Plan: General   Post-op Pain Management:    Induction: Intravenous  PONV Risk Score and Plan: 3 and Ondansetron, Dexamethasone, Treatment may vary due to age or medical condition and Midazolam  Airway Management Planned: LMA and Oral ETT  Additional Equipment:   Intra-op Plan:   Post-operative Plan:   Informed Consent: I have reviewed the patients History and Physical, chart, labs and discussed the procedure including the risks, benefits and alternatives for the proposed anesthesia with the patient or authorized representative who has indicated his/her understanding and acceptance.   Dental Advisory Given  Plan Discussed with: CRNA  Anesthesia Plan Comments: ( )        Anesthesia Quick Evaluation

## 2018-12-18 NOTE — Anesthesia Postprocedure Evaluation (Signed)
Anesthesia Post Note  Patient: Tasha Martin  Procedure(s) Performed: SUCTION DILATATION AND EVACUATION (N/A Uterus)     Patient location during evaluation: PACU Anesthesia Type: General Level of consciousness: awake and alert Pain management: pain level controlled Vital Signs Assessment: post-procedure vital signs reviewed and stable Respiratory status: spontaneous breathing, nonlabored ventilation, respiratory function stable and patient connected to nasal cannula oxygen Cardiovascular status: blood pressure returned to baseline and stable Postop Assessment: no apparent nausea or vomiting Anesthetic complications: no    Last Vitals:  Vitals:   12/17/18 1430 12/17/18 1507  BP:  (!) 130/54  Pulse: 62 65  Resp: 16 18  Temp:    SpO2: 99% 100%    Last Pain:  Vitals:   12/17/18 1507  TempSrc:   PainSc: 0-No pain   Pain Goal: Patients Stated Pain Goal: 4 (12/17/18 1110)               Werner Labella

## 2018-12-19 ENCOUNTER — Encounter (HOSPITAL_COMMUNITY): Payer: Self-pay | Admitting: Obstetrics and Gynecology

## 2018-12-20 NOTE — Addendum Note (Signed)
Addendum  created 12/20/18 1443 by Donney DiceKelly, Rayjon Wery D, CRNA   Charge Capture section accepted

## 2019-03-31 ENCOUNTER — Encounter (HOSPITAL_COMMUNITY): Payer: Self-pay

## 2019-03-31 ENCOUNTER — Inpatient Hospital Stay (HOSPITAL_COMMUNITY): Payer: 59

## 2019-03-31 ENCOUNTER — Other Ambulatory Visit: Payer: Self-pay

## 2019-03-31 ENCOUNTER — Inpatient Hospital Stay (HOSPITAL_COMMUNITY)
Admission: AD | Admit: 2019-03-31 | Discharge: 2019-03-31 | Disposition: A | Discharge status: Home / Self Care | Payer: 59 | Attending: Obstetrics & Gynecology | Admitting: Obstetrics & Gynecology

## 2019-03-31 DIAGNOSIS — Z6791 Unspecified blood type, Rh negative: Secondary | ICD-10-CM | POA: Insufficient documentation

## 2019-03-31 DIAGNOSIS — Z792 Long term (current) use of antibiotics: Secondary | ICD-10-CM | POA: Insufficient documentation

## 2019-03-31 DIAGNOSIS — O3680X0 Pregnancy with inconclusive fetal viability, not applicable or unspecified: Secondary | ICD-10-CM | POA: Insufficient documentation

## 2019-03-31 DIAGNOSIS — O360910 Maternal care for other rhesus isoimmunization, first trimester, not applicable or unspecified: Secondary | ICD-10-CM | POA: Diagnosis not present

## 2019-03-31 DIAGNOSIS — Z8041 Family history of malignant neoplasm of ovary: Secondary | ICD-10-CM | POA: Diagnosis not present

## 2019-03-31 DIAGNOSIS — O209 Hemorrhage in early pregnancy, unspecified: Secondary | ICD-10-CM | POA: Diagnosis not present

## 2019-03-31 DIAGNOSIS — Z3A01 Less than 8 weeks gestation of pregnancy: Secondary | ICD-10-CM | POA: Diagnosis not present

## 2019-03-31 DIAGNOSIS — O34219 Maternal care for unspecified type scar from previous cesarean delivery: Secondary | ICD-10-CM | POA: Insufficient documentation

## 2019-03-31 DIAGNOSIS — Z809 Family history of malignant neoplasm, unspecified: Secondary | ICD-10-CM | POA: Insufficient documentation

## 2019-03-31 DIAGNOSIS — Z833 Family history of diabetes mellitus: Secondary | ICD-10-CM | POA: Diagnosis not present

## 2019-03-31 DIAGNOSIS — O26891 Other specified pregnancy related conditions, first trimester: Secondary | ICD-10-CM | POA: Insufficient documentation

## 2019-03-31 DIAGNOSIS — Z79899 Other long term (current) drug therapy: Secondary | ICD-10-CM | POA: Diagnosis not present

## 2019-03-31 LAB — URINALYSIS, ROUTINE W REFLEX MICROSCOPIC
Bacteria, UA: NONE SEEN
Bilirubin Urine: NEGATIVE
Glucose, UA: NEGATIVE mg/dL
Ketones, ur: NEGATIVE mg/dL
Leukocytes,Ua: NEGATIVE
Nitrite: NEGATIVE
Protein, ur: NEGATIVE mg/dL
Specific Gravity, Urine: 1.02 (ref 1.005–1.030)
pH: 6 (ref 5.0–8.0)

## 2019-03-31 LAB — WET PREP, GENITAL
Clue Cells Wet Prep HPF POC: NONE SEEN
Sperm: NONE SEEN
Trich, Wet Prep: NONE SEEN
Yeast Wet Prep HPF POC: NONE SEEN

## 2019-03-31 LAB — CBC
HCT: 39.6 % (ref 36.0–46.0)
Hemoglobin: 13.2 g/dL (ref 12.0–15.0)
MCH: 31.8 pg (ref 26.0–34.0)
MCHC: 33.3 g/dL (ref 30.0–36.0)
MCV: 95.4 fL (ref 80.0–100.0)
Platelets: 278 10*3/uL (ref 150–400)
RBC: 4.15 MIL/uL (ref 3.87–5.11)
RDW: 11.9 % (ref 11.5–15.5)
WBC: 6.2 10*3/uL (ref 4.0–10.5)
nRBC: 0 % (ref 0.0–0.2)

## 2019-03-31 LAB — HCG, QUANTITATIVE, PREGNANCY: hCG, Beta Chain, Quant, S: 12518 m[IU]/mL — ABNORMAL HIGH (ref <5)

## 2019-03-31 MED ORDER — CYCLOBENZAPRINE HCL 10 MG PO TABS: 5.0000 mg | ORAL_TABLET | Freq: Once | ORAL | Status: DC

## 2019-03-31 MED ORDER — RHO D IMMUNE GLOBULIN 1500 UNIT/2ML IJ SOSY
300.0000 ug | PREFILLED_SYRINGE | Freq: Once | INTRAMUSCULAR | Status: AC
  Administered 2019-03-31: 300 ug via INTRAMUSCULAR
  Filled 2019-03-31: qty 2

## 2019-03-31 NOTE — Discharge Instructions (Signed)
Return to care  °· If you have heavier bleeding that soaks through more that 2 pads per hour for an hour or more °· If you bleed so much that you feel like you might pass out or you do pass out °· If you have significant abdominal pain that is not improved with Tylenol  ° ° ° ° °Vaginal Bleeding During Pregnancy, First Trimester ° °A small amount of bleeding from the vagina (spotting) is relatively common during early pregnancy. It usually stops on its own. Various things may cause bleeding or spotting during early pregnancy. Some bleeding may be related to the pregnancy, and some may not. In many cases, the bleeding is normal and is not a problem. However, bleeding can also be a sign of something serious. Be sure to tell your health care provider about any vaginal bleeding right away. °Some possible causes of vaginal bleeding during the first trimester include: °· Infection or inflammation of the cervix. °· Growths (polyps) on the cervix. °· Miscarriage or threatened miscarriage. °· Pregnancy tissue developing outside of the uterus (ectopic pregnancy). °· A mass of tissue developing in the uterus due to an egg being fertilized incorrectly (molar pregnancy). °Follow these instructions at home: °Activity °· Follow instructions from your health care provider about limiting your activity. Ask what activities are safe for you. °· If needed, make plans for someone to help with your regular activities. °· Do not have sex or orgasms until your health care provider says that this is safe. °General instructions °· Take over-the-counter and prescription medicines only as told by your health care provider. °· Pay attention to any changes in your symptoms. °· Do not use tampons or douche. °· Write down how many pads you use each day, how often you change pads, and how soaked (saturated) they are. °· If you pass any tissue from your vagina, save the tissue so you can show it to your health care provider. °· Keep all follow-up  visits as told by your health care provider. This is important. °Contact a health care provider if: °· You have vaginal bleeding during any part of your pregnancy. °· You have cramps or labor pains. °· You have a fever. °Get help right away if: °· You have severe cramps in your back or abdomen. °· You pass large clots or a large amount of tissue from your vagina. °· Your bleeding increases. °· You feel light-headed or weak, or you faint. °· You have chills. °· You are leaking fluid or have a gush of fluid from your vagina. °Summary °· A small amount of bleeding (spotting) from the vagina is relatively common during early pregnancy. °· Various things may cause bleeding or spotting in early pregnancy. °· Be sure to tell your health care provider about any vaginal bleeding right away. °This information is not intended to replace advice given to you by your health care provider. Make sure you discuss any questions you have with your health care provider. °Document Released: 09/03/2005 Document Revised: 02/26/2017 Document Reviewed: 02/26/2017 °Elsevier Interactive Patient Education © 2019 Elsevier Inc. ° °

## 2019-03-31 NOTE — MAU Note (Signed)
Pt reprots she had some vag spotting today when she wiped. Denies any intercourse (but some fondling) bleeding followed that.Denies any pain or cramping at this time.

## 2019-03-31 NOTE — MAU Provider Note (Signed)
Chief Complaint: Vaginal Bleeding   First Provider Initiated Contact with Patient 03/31/19 1840     SUBJECTIVE HPI: Tasha Martin is a 33 y.o. G3P1011 at [redacted]w[redacted]d who presents to Maternity Admissions reporting vaginal bleeding. Symptoms started today. Reports pink spotting on toilet paper that also left a streak on her panty liner. Denies abdominal pain or recent intercourse. Denies dysuria or vaginal discharge. Has not been seen yet at [facility] with this pregnancy but is an established patient there.    Past Medical History:  Diagnosis Date  . Abnormal Pap smear 2004   cryo  . Carbuncle 01/2011  . 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 H/O candidiasis   . H/O chlamydia infection 12/25/2011  . H/O constipation 03/2009  . H/O dysmenorrhea 01/2004  . H/O measles   . H/O varicella   . Headache(784.0)    migraines  . 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 Hx: UTI (urinary tract infection)    Frequently during childhood   . Pain pelvic 01/2005   OB History  Gravida Para Term Preterm AB Living  0 1 1  SAB TAB Ectopic Multiple Live Births  1 0 0 0 1    # Outcome Date GA Lbr Len/2nd Weight Sex Delivery Anes PTL Lv  3 Current           2 SAB 12/2018          1 Term 11/23/11 [redacted]w[redacted]d  3359 g F CS-LTranv EPI  LIV   Past Surgical History:  Procedure Laterality Date  . CESAREAN SECTION  11/23/2011   Procedure: CESAREAN SECTION;  Surgeon: Janine Limbo, MD;  Location: [facility];  Service: Gynecology;  Laterality: N/A;  primary cesarean section of baby girl at 2248  APGAR  9/9  . CRYOABLATION    . DILATION AND EVACUATION N/A 12/17/2018   Procedure: SUCTION DILATATION AND EVACUATION;  Surgeon: Jaymes Graff, MD;  Location: [facility];  Service: Gynecology;  Laterality: N/A;  with Ultrasound  . MOUTH SURGERY  2011  . WISDOM TOOTH EXTRACTION  2011  . WRIST SURGERY     Social History   Socioeconomic History  . Marital status: Married    Spouse name: Not on file  . Number of children:  Not on file  . Years of education: Not on file  . Highest education level: Not on file  Occupational History  . Not on file  Social Needs  . Financial resource strain: Not on file  . Food insecurity:    Worry: Not on file    Inability: Not on file  . Transportation needs:    Medical: Not on file    Non-medical: Not on file  Tobacco Use  . Smoking status: Never Smoker  . Smokeless tobacco: Never Used  Substance and Sexual Activity  . Alcohol use: Not Currently    Comment: OCCASIONAL  . Drug use: No  . Sexual activity: Not Currently    Birth control/protection: Condom, Injection    Comment: DEPO PROVERA  Lifestyle  . Physical activity:    Days per week: Not on file    Minutes per session: Not on file  . Stress: Not on file  Relationships  . Social connections:    Talks on phone: Not on file    Gets together: Not on file    Attends religious service: Not on file    Active member of club or organization: Not on file    Attends  meetings of clubs or organizations: Not on file    Relationship status: Not on file  . Intimate partner violence:    Fear of current or ex partner: Not on file    Emotionally abused: Not on file    Physically abused: Not on file    Forced sexual activity: Not on file  Other Topics Concern  . Not on file  Social History Narrative  . Not on file   Family History  Problem Relation Age of Onset  . Hypertension Mother   . Hypertension Father   . COPD Father   . Heart disease Father   . Arthritis Maternal Grandmother   . Diabetes Maternal Grandmother   . Hypertension Maternal Grandmother   . Arthritis Maternal Grandfather   . Diabetes Maternal Grandfather   . Hypertension Maternal Grandfather   . Hypertension Paternal Grandmother   . Hypertension Paternal Grandfather   . Cancer Son   . Cancer Maternal Aunt        ovarian  . Alcohol abuse Maternal Uncle    No current facility-administered medications on file prior to encounter.    Current  Outpatient Medications on File Prior to Encounter  Medication Sig Dispense Refill  . prenatal vitamin w/FE, FA (PRENATAL 1 + 1) 27-1 MG TABS Take 1 tablet by mouth daily.      . calcium carbonate 200 MG capsule Take 250 mg by mouth 2 (two) times daily with a meal.    . ibuprofen (ADVIL,MOTRIN) 600 MG tablet Take 1 tablet (600 mg total) by mouth every 6 (six) hours as needed. 30 tablet 0  . metroNIDAZOLE (METROGEL VAGINAL) 0.75 % vaginal gel 1 applicatorful in vagina daily for 5 days 70 g 0   No Known Allergies  I have reviewed patient's Past Medical Hx, Surgical Hx, Family Hx, Social Hx, medications and allergies.   Review of Systems  Constitutional: Negative.   Gastrointestinal: Negative.   Genitourinary: Positive for vaginal bleeding. Negative for dysuria and vaginal discharge.    OBJECTIVE Patient Vitals for the past 24 hrs:  BP Temp Pulse Resp Height Weight  03/31/19 1753 115/62 98.6 F (37 C) 77 18  (1.651 m) 87.1 kg   Constitutional: Well-developed, well-nourished female in no acute distress.  Cardiovascular: normal rate & rhythm, no murmur Respiratory: normal rate and effort. Lung sounds clear throughout GI: Abd soft, non-tender, Pos BS x 4. No guarding or rebound tenderness MS: Extremities nontender, no edema, normal ROM Neurologic: Alert and oriented x 4.  GU:     SPECULUM EXAM: NEFG, small amount of old brown blood. No active bleeding. Cervix pink/smooth/no friability.   BIMANUAL: No CMT. cervix closed; uterus normal size, no adnexal tenderness or masses.    LAB RESULTS Results for orders placed or performed during the hospital encounter of 03/31/19 (from the past 24 hour(s))  CBC     Status: None   Collection Time: 03/31/19  5:42 PM  Result Value Ref Range   WBC 6.2 4.0 - 10.5 K/uL   RBC 4.15 3.87 - 5.11 MIL/uL   Hemoglobin 13.2 12.0 - 15.0 g/dL   HCT 16.1 09.6 - 04.5 %   MCV 95.4 80.0 - 100.0 fL   MCH 31.8 26.0 - 34.0 pg   MCHC 33.3 30.0 - 36.0 g/dL   RDW  40.9 81.1 - 91.4 %   Platelets 278 150 - 400 K/uL   nRBC 0.0 0.0 - 0.2 %  hCG, quantitative, pregnancy     Status: Abnormal  Collection Time: 03/31/19  5:42 PM  Result Value Ref Range   hCG, Beta Chain, Quant, S 12,518 (H) <5 mIU/mL  Rh IG workup (includes ABO/Rh)     Status: None (Preliminary result)   Collection Time: 03/31/19  5:42 PM  Result Value Ref Range   Gestational Age(Wks) 5    ABO/RH(D) O NEG    Antibody Screen NEG    Unit Number [id number]    Blood Component Type RHIG    Unit division 00    Status of Unit ISSUED    Transfusion Status      OK TO TRANSFUSE Performed at [facility], [address] [place], [place] [address]   Urinalysis, Routine w reflex microscopic     Status: Abnormal   Collection Time: 03/31/19  6:03 PM  Result Value Ref Range   Color, Urine YELLOW YELLOW   APPearance HAZY (A) CLEAR   Specific Gravity, Urine 1.020 1.005 - 1.030   pH 6.0 5.0 - 8.0   Glucose, UA NEGATIVE NEGATIVE mg/dL   Hgb urine dipstick MODERATE (A) NEGATIVE   Bilirubin Urine NEGATIVE NEGATIVE   Ketones, ur NEGATIVE NEGATIVE mg/dL   Protein, ur NEGATIVE NEGATIVE mg/dL   Nitrite NEGATIVE NEGATIVE   Leukocytes,Ua NEGATIVE NEGATIVE   RBC / HPF 6-10 0 - 5 RBC/hpf   WBC, UA 0-5 0 - 5 WBC/hpf   Bacteria, UA NONE SEEN NONE SEEN   Squamous Epithelial / LPF 0-5 0 - 5   Mucus PRESENT   Wet prep, genital     Status: Abnormal   Collection Time: 03/31/19  6:48 PM  Result Value Ref Range   Yeast Wet Prep HPF POC NONE SEEN NONE SEEN   Trich, Wet Prep NONE SEEN NONE SEEN   Clue Cells Wet Prep HPF POC NONE SEEN NONE SEEN   WBC, Wet Prep HPF POC MODERATE (A) NONE SEEN   Sperm NONE SEEN     IMAGING US Ob Less Than 14 Weeks With Ob Transvaginal  Result Date: 03/31/2019 CLINICAL DATA:  First trimester pregnancy. Vaginal spotting. Beta HCG level 12,518. LMP 02/18/2019. EXAM: OBSTETRIC <14 WK [place] AND TRANSVAGINAL OB US TECHNIQUE: Both transabdominal and transvaginal ultrasound  examinations were performed for complete evaluation of the gestation as well as the maternal uterus, adnexal regions, and pelvic cul-de-sac. Transvaginal technique was performed to assess early pregnancy. COMPARISON:  None. FINDINGS: Intrauterine gestational sac: Single. Yolk sac:  Not visualized. Embryo:  Not visualized. Cardiac Activity: Not visualized. MSD: 8.0 mm   5 w   3 d Subchorionic hemorrhage:  None visualized. Maternal uterus/adnexae: No adnexal mass. The maternal right ovary is not visualized. No significant free pelvic fluid. IMPRESSION: Probable early intrauterine gestational sac, but no yolk sac, fetal pole, or cardiac activity yet visualized. Recommend follow-up quantitative B-HCG levels and follow-up US in 14 days to assess viability. This recommendation follows SRU consensus guidelines: Diagnostic Criteria for Nonviable Pregnancy Early in the First Trimester. Malva Limes Med 2013; 993:5701-77. Electronically Signed   By: Carey Bullocks M.D.   On: 03/31/2019 20:02    MAU COURSE Orders Placed This Encounter  Procedures  . Wet prep, genital  . US OB LESS THAN 14 WEEKS WITH OB TRANSVAGINAL  . CBC  . hCG, quantitative, pregnancy  . Urinalysis, Routine w reflex microscopic  . Rh IG workup (includes ABO/Rh)  . Discharge patient   Meds ordered this encounter  Medications  . rho (d) immune globulin (RHIG/RHOPHYLAC) injection 300 mcg  MDM +UPT UA, wet prep, GC/chlamydia, CBC, ABO/Rh, quant hCG, and US today to rule out ectopic pregnancy  RH negative. Rhogam given in MAU today.   Ultrasound shows empty IUGS. HCG 12000. Patient with no pain & stable VS. Will bring patient back on Sunday for repeat HCG.   ASSESSMENT 1. Pregnancy of unknown anatomic location   2. Vaginal bleeding in pregnancy, first trimester   3. Rh negative state in antepartum period, first trimester     PLAN Discharge home in stable condition. SAB vs ectopic precautions  Follow-up Information    [facility]. Go on 04/03/2019.   Specialty:  Obstetrics and Gynecology Why:  for repeat blood work Contact information: [address] [id number] [place] [id number] [phone]         Allergies as of 03/31/2019   No Known Allergies     Medication List    STOP taking these medications   ibuprofen 600 MG tablet Commonly known as:  ADVIL   metroNIDAZOLE 0.75 % vaginal gel Commonly known as:  METROGEL VAGINAL     TAKE these medications   calcium carbonate 200 MG capsule Take 250 mg by mouth 2 (two) times daily with a meal.   prenatal vitamin w/FE, FA 27-1 MG Tabs tablet Take 1 tablet by mouth daily.        Judeth HornLawrence, Flynn Lininger, NP 03/31/2019  8:27 PM

## 2019-04-01 LAB — GC/CHLAMYDIA PROBE AMP (~~LOC~~) NOT AT ARMC
Chlamydia: NEGATIVE
Neisseria Gonorrhea: NEGATIVE

## 2019-04-01 LAB — RH IG WORKUP (INCLUDES ABO/RH)
ABO/RH(D): O NEG
Antibody Screen: NEGATIVE
Gestational Age(Wks): 5
Unit division: 0

## 2019-04-03 ENCOUNTER — Inpatient Hospital Stay (HOSPITAL_COMMUNITY)
Admission: AD | Admit: 2019-04-03 | Discharge: 2019-04-03 | Disposition: A | Discharge status: Home / Self Care | Payer: 59 | Source: Ambulatory Visit | Attending: Obstetrics & Gynecology | Admitting: Obstetrics & Gynecology

## 2019-04-03 ENCOUNTER — Other Ambulatory Visit: Payer: Self-pay

## 2019-04-03 DIAGNOSIS — Z3491 Encounter for supervision of normal pregnancy, unspecified, first trimester: Secondary | ICD-10-CM | POA: Diagnosis not present

## 2019-04-03 LAB — HCG, QUANTITATIVE, PREGNANCY: hCG, Beta Chain, Quant, S: 24927 m[IU]/mL — ABNORMAL HIGH (ref <5)

## 2019-04-03 NOTE — MAU Provider Note (Signed)
First Provider Initiated Contact with Patient 04/03/19 1651     S Ms. Tasha Martin is a 33 y.o. G77P1011 pregnant female who presents to MAU today for repeat Quant hCG. She denies abdominal cramping and vaginal bleeding.  O BP (!) 117/58 (BP Location: Right Arm)   Pulse 71   Temp 98.2 F (36.8 C) (Oral)   Resp 18   LMP 02/18/2019   SpO2 100%  Physical Exam  Nursing note and vitals reviewed. Constitutional: She is oriented to person, place, and time. She appears well-developed and well-nourished.  Cardiovascular: Normal rate.  Respiratory: Effort normal.  GI: Soft.  Neurological: She is alert and oriented to person, place, and time.  Skin: Skin is warm and dry.  Psychiatric: She has a normal mood and affect. Her behavior is normal. Thought content normal.    A  Medical screening exam complete Appropriate rise in Quant (12,518 on 04/23, now 24,927)  P Discharge from MAU in stable condition Discussed plan for follow-up ultrasound around May 7. Patient prefers to follow-up via [facility] Patient may return to MAU as needed for pregnancy related complaints  Calvert Cantor, [place] 04/03/2019 4:57 PM

## 2019-04-03 NOTE — MAU Note (Signed)
Pt here for fu labs. Denies pain or bleeding. Labs drawn in triage

## 2019-04-21 ENCOUNTER — Encounter (HOSPITAL_BASED_OUTPATIENT_CLINIC_OR_DEPARTMENT_OTHER): Payer: Self-pay | Admitting: *Deleted

## 2019-04-21 ENCOUNTER — Other Ambulatory Visit: Payer: Self-pay | Admitting: Obstetrics and Gynecology

## 2019-04-21 ENCOUNTER — Other Ambulatory Visit: Payer: Self-pay

## 2019-04-21 NOTE — H&P (Signed)
Tasha Martin is a 33 y.o. female,  G: 3; P: 1-0-1-1  who presents for dilatation and evacuation because of a blighted ovum.  The patient's  last menstrual period was 02/18/2019 and she denied any cramping or vaginal bleeding since that time until she was seen on 03/31/2019 at [facility] for pelvic pain and vaginal bleeding. An ultrasound at  that time  showed a gestational sac of 5 w 3 d and QHCG = 12, 518.   A repeat QHCG on 04/03/2019 returned 24,927; The patient was given Rhogam for her O negative  blood type.  She has not had any pelvic pain nor bleeding since that visit and a  QHCG  on  04/11/2019 was  89,331. A follow up pelvic ultrasound on 04/21/2019 revealed an 8 week 5 day gestational sac with no fetal pole,  yolk sac or subchorionic hemorrhage.  Both adnexae were unremarkable and ovaries were normal in appearance.  A review of both  medical and surgical management options were  given to the patient, to include expectant management, however, she has chosen to proceed with surgical intervention.   Past Medical History  OB History: G: 3;  P:1-0-1-1 ;  C-section 2012   GYN History: menarche: 33 YO     LMP: 02/18/2019  Remote history of abnormal PAP smear, treated with cryotherapy and has been normal since.  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     Physical Exam Bp: 108/58   Weight: 197 lbs.  Height: 5\' 5"   BMI: 32.8  Neck: supple without masses or thyromegaly Lungs: clear to auscultation Heart: regular rate and rhythm Abdomen: soft, non-tender and no organomegaly Pelvic:EGBUS- wnl; vagina-normal rugae; uterus-normal size, cervix without lesions or motion tenderness; adnexae-no tenderness or masses Extremities:  no clubbing, cyanosis or edema   Assesment: Blighted Ovum                      Blood Type O Negative  Disposition:  A discussion was held with patient regarding the indication for her procedure(s) along with the risks, which include but are not limited to: reaction to anesthesia, damage to adjacent organs, infection,  excessive bleeding and uterine scarring.  The patient verbalizes understanding of these risks and has consented to proceed with a Dilation and Evacuation at [facility] on Apr 22, 2019.   CSN# [id number]   Karmello Abercrombie J. Lowell Guitar, PA-C  for Dr. Crist Fat.Rivard

## 2019-04-22 ENCOUNTER — Encounter (HOSPITAL_BASED_OUTPATIENT_CLINIC_OR_DEPARTMENT_OTHER): Payer: Self-pay | Admitting: *Deleted

## 2019-04-22 ENCOUNTER — Ambulatory Visit (HOSPITAL_BASED_OUTPATIENT_CLINIC_OR_DEPARTMENT_OTHER): Payer: 59 | Admitting: Anesthesiology

## 2019-04-22 ENCOUNTER — Other Ambulatory Visit (HOSPITAL_COMMUNITY)
Admission: RE | Admit: 2019-04-22 | Discharge: 2019-04-22 | Disposition: A | Discharge status: Home / Self Care | Payer: 59 | Source: Ambulatory Visit | Attending: Obstetrics and Gynecology | Admitting: Obstetrics and Gynecology

## 2019-04-22 ENCOUNTER — Ambulatory Visit (HOSPITAL_BASED_OUTPATIENT_CLINIC_OR_DEPARTMENT_OTHER)
Admission: RE | Admit: 2019-04-22 | Discharge: 2019-04-22 | Disposition: A | Discharge status: Home / Self Care | Payer: 59 | Attending: Obstetrics and Gynecology | Admitting: Obstetrics and Gynecology

## 2019-04-22 ENCOUNTER — Encounter (HOSPITAL_BASED_OUTPATIENT_CLINIC_OR_DEPARTMENT_OTHER)
Admission: RE | Disposition: A | Discharge status: Home / Self Care | Payer: Self-pay | Source: Home / Self Care | Attending: Obstetrics and Gynecology

## 2019-04-22 DIAGNOSIS — Z1159 Encounter for screening for other viral diseases: Secondary | ICD-10-CM | POA: Insufficient documentation

## 2019-04-22 DIAGNOSIS — O020 Blighted ovum and nonhydatidiform mole: Secondary | ICD-10-CM | POA: Insufficient documentation

## 2019-04-22 HISTORY — PX: DILATION AND EVACUATION: SHX1459

## 2019-04-22 LAB — SARS CORONAVIRUS 2 BY RT PCR (HOSPITAL ORDER, PERFORMED IN ~~LOC~~ HOSPITAL LAB): SARS Coronavirus 2: NEGATIVE

## 2019-04-22 LAB — CBC
HCT: 38.1 % (ref 36.0–46.0)
Hemoglobin: 13 g/dL (ref 12.0–15.0)
MCH: 32 pg (ref 26.0–34.0)
MCHC: 34.1 g/dL (ref 30.0–36.0)
MCV: 93.8 fL (ref 80.0–100.0)
Platelets: 267 10*3/uL (ref 150–400)
RBC: 4.06 MIL/uL (ref 3.87–5.11)
RDW: 11.9 % (ref 11.5–15.5)
WBC: 6.6 10*3/uL (ref 4.0–10.5)
nRBC: 0 % (ref 0.0–0.2)

## 2019-04-22 SURGERY — DILATION AND EVACUATION, UTERUS
Anesthesia: General | Site: Vagina

## 2019-04-22 MED ORDER — EPHEDRINE SULFATE 50 MG/ML IJ SOLN
INTRAMUSCULAR | Status: DC | PRN
  Administered 2019-04-22: 10 mg via INTRAVENOUS

## 2019-04-22 MED ORDER — 0.9 % SODIUM CHLORIDE (POUR BTL) OPTIME
TOPICAL | Status: DC | PRN
  Administered 2019-04-22: 1000 mL

## 2019-04-22 MED ORDER — DEXAMETHASONE SODIUM PHOSPHATE 4 MG/ML IJ SOLN
INTRAMUSCULAR | Status: DC | PRN
  Administered 2019-04-22: 10 mg via INTRAVENOUS

## 2019-04-22 MED ORDER — CHLOROPROCAINE HCL 1 % IJ SOLN
INTRAMUSCULAR | Status: AC
  Filled 2019-04-22: qty 30

## 2019-04-22 MED ORDER — FENTANYL CITRATE (PF) 100 MCG/2ML IJ SOLN: 25.0000 ug | INTRAMUSCULAR | Status: DC | PRN

## 2019-04-22 MED ORDER — ONDANSETRON HCL 4 MG/2ML IJ SOLN
INTRAMUSCULAR | Status: DC | PRN
  Administered 2019-04-22: 4 mg via INTRAVENOUS

## 2019-04-22 MED ORDER — BUPIVACAINE HCL (PF) 0.5 % IJ SOLN
INTRAMUSCULAR | Status: AC
  Filled 2019-04-22: qty 30

## 2019-04-22 MED ORDER — BUPIVACAINE-EPINEPHRINE (PF) 0.5% -1:200000 IJ SOLN
INTRAMUSCULAR | Status: AC
  Filled 2019-04-22: qty 60

## 2019-04-22 MED ORDER — PROMETHAZINE HCL 25 MG/ML IJ SOLN: 6.2500 mg | INTRAMUSCULAR | Status: DC | PRN

## 2019-04-22 MED ORDER — FENTANYL CITRATE (PF) 100 MCG/2ML IJ SOLN
INTRAMUSCULAR | Status: AC
  Filled 2019-04-22: qty 2

## 2019-04-22 MED ORDER — KETOROLAC TROMETHAMINE 30 MG/ML IJ SOLN: 30.0000 mg | Freq: Once | INTRAMUSCULAR | Status: DC

## 2019-04-22 MED ORDER — FENTANYL CITRATE (PF) 100 MCG/2ML IJ SOLN
50.0000 ug | INTRAMUSCULAR | Status: AC | PRN
  Administered 2019-04-22: 25 ug via INTRAVENOUS
  Administered 2019-04-22: 50 ug via INTRAVENOUS
  Administered 2019-04-22: 25 ug via INTRAVENOUS

## 2019-04-22 MED ORDER — WHITE PETROLATUM EX OINT
TOPICAL_OINTMENT | CUTANEOUS | Status: AC
  Filled 2019-04-22: qty 28.35

## 2019-04-22 MED ORDER — ONDANSETRON HCL 4 MG/2ML IJ SOLN
INTRAMUSCULAR | Status: AC
  Filled 2019-04-22: qty 2

## 2019-04-22 MED ORDER — PROPOFOL 10 MG/ML IV BOLUS
INTRAVENOUS | Status: AC
  Filled 2019-04-22: qty 20

## 2019-04-22 MED ORDER — CHLOROPROCAINE HCL 1 % IJ SOLN
INTRAMUSCULAR | Status: DC | PRN
  Administered 2019-04-22: 10 mL

## 2019-04-22 MED ORDER — LIDOCAINE HCL (CARDIAC) PF 100 MG/5ML IV SOSY
PREFILLED_SYRINGE | INTRAVENOUS | Status: DC | PRN
  Administered 2019-04-22: 80 mg via INTRAVENOUS

## 2019-04-22 MED ORDER — MIDAZOLAM HCL 2 MG/2ML IJ SOLN
INTRAMUSCULAR | Status: AC
  Filled 2019-04-22: qty 2

## 2019-04-22 MED ORDER — MIDAZOLAM HCL 2 MG/2ML IJ SOLN
1.0000 mg | INTRAMUSCULAR | Status: DC | PRN
  Administered 2019-04-22: 2 mg via INTRAVENOUS

## 2019-04-22 MED ORDER — LACTATED RINGERS IV SOLN
INTRAVENOUS | Status: DC
  Administered 2019-04-22: 13:00:00 via INTRAVENOUS

## 2019-04-22 MED ORDER — DEXAMETHASONE SODIUM PHOSPHATE 10 MG/ML IJ SOLN
INTRAMUSCULAR | Status: AC
  Filled 2019-04-22: qty 1

## 2019-04-22 MED ORDER — PROPOFOL 10 MG/ML IV BOLUS
INTRAVENOUS | Status: DC | PRN
  Administered 2019-04-22: 150 mg via INTRAVENOUS
  Administered 2019-04-22: 50 mg via INTRAVENOUS

## 2019-04-22 MED ORDER — METHYLERGONOVINE MALEATE 0.2 MG PO TABS
ORAL_TABLET | ORAL | Status: AC
  Filled 2019-04-22: qty 1

## 2019-04-22 MED ORDER — SCOPOLAMINE 1 MG/3DAYS TD PT72: 1.0000 | MEDICATED_PATCH | Freq: Once | TRANSDERMAL | Status: DC | PRN

## 2019-04-22 MED ORDER — LIDOCAINE 2% (20 MG/ML) 5 ML SYRINGE
INTRAMUSCULAR | Status: AC
  Filled 2019-04-22: qty 5

## 2019-04-22 MED ORDER — BUPIVACAINE HCL (PF) 0.25 % IJ SOLN
INTRAMUSCULAR | Status: AC
  Filled 2019-04-22: qty 30

## 2019-04-22 MED ORDER — OXYTOCIN 10 UNIT/ML IJ SOLN
INTRAMUSCULAR | Status: AC
  Filled 2019-04-22: qty 1

## 2019-04-22 SURGICAL SUPPLY — 20 items
BRIEF STRETCH FOR OB PAD XXL (UNDERPADS AND DIAPERS) ×3 IMPLANT
CATH ROBINSON RED A/P 16FR (CATHETERS) ×3 IMPLANT
DECANTER SPIKE VIAL GLASS SM (MISCELLANEOUS) ×1 IMPLANT
GLOVE BIOGEL PI IND STRL 7.0 (GLOVE) ×2 IMPLANT
GLOVE BIOGEL PI INDICATOR 7.0 (GLOVE) ×4
GLOVE ECLIPSE 6.5 STRL STRAW (GLOVE) ×3 IMPLANT
GOWN STRL REUS W/ TWL LRG LVL3 (GOWN DISPOSABLE) ×2 IMPLANT
GOWN STRL REUS W/TWL LRG LVL3 (GOWN DISPOSABLE) ×6
HIBICLENS CHG 4% 4OZ BTL (MISCELLANEOUS) ×1 IMPLANT
KIT BERKELEY 1ST TRIMESTER 3/8 (MISCELLANEOUS) ×3 IMPLANT
NS IRRIG 1000ML POUR BTL (IV SOLUTION) ×3 IMPLANT
PACK VAGINAL MINOR WOMEN LF (CUSTOM PROCEDURE TRAY) ×3 IMPLANT
PAD OB MATERNITY 4.3X12.25 (PERSONAL CARE ITEMS) ×3 IMPLANT
PAD PREP 24X48 CUFFED NSTRL (MISCELLANEOUS) ×3 IMPLANT
SET BERKELEY SUCTION TUBING (SUCTIONS) ×3 IMPLANT
TOWEL OR 17X24 6PK STRL BLUE (TOWEL DISPOSABLE) ×3 IMPLANT
VACURETTE 10 RIGID CVD (CANNULA) IMPLANT
VACURETTE 7MM CVD STRL WRAP (CANNULA) IMPLANT
VACURETTE 8 RIGID CVD (CANNULA) IMPLANT
VACURETTE 9 RIGID CVD (CANNULA) ×2 IMPLANT

## 2019-04-22 NOTE — Op Note (Signed)
Preoperative diagnosis: Blighted ovum  Postoperative diagnosis: Same  Anesthesia: IV sedation and paracervical block  Anesthesiologist: Dr. Desmond Lope  Procedure: Dilatation and evacuation  Surgeon: Dr. Dois Davenport Edmund Holcomb  Estimated blood loss: Minimal  Procedure:  After being informed of the planned procedure with possible complications including bleeding, infection and injury to uterus, informed consent is obtained and patient is taken to or #1. She is placed in the lithotomy position and given IV sedation without complication. She is then prepped and draped in a sterile  fashion and her bladder is emptied with an in and out red rubber catheter.  Pelvic exam reveals a anteverted  uterus compatible with [redacted] weeks gestation. Both adnexa are felt and normal.  A weighted speculum is inserted in the vagina and the anterior lip of the cervix was grasped with a tenaculum forcep. We proceed with a paracervical block using 10 cc of Nesacaine 1%. The uterus was then sounded at 11 cm. The cervix was easily dilated using Hegar dilator until  #27. Using a #9 curved cannula, we proceed with evacuation of products of conception without difficulty. We then proceed with sharp curettage of the uterine cavity to confirm complete evacuation.  Instruments are then removed. Instrument and sponge count is complete x2.   Estimated blood loss is minimal.  The procedure is very well tolerated by the patient is taken to recovery room in a well and stable condition.  Specimen: Products of conception sent to pathology

## 2019-04-22 NOTE — Transfer of Care (Signed)
Immediate Anesthesia Transfer of Care Note  Patient: Tasha Martin  Procedure(s) Performed: Suction DILATATION AND EVACUATION (N/A Vagina )  Patient Location: PACU  Anesthesia Type:General  Level of Consciousness: sedated  Airway & Oxygen Therapy: Patient Spontanous Breathing and Patient connected to nasal cannula oxygen  Post-op Assessment: Report given to RN and Post -op Vital signs reviewed and stable  Post vital signs: Reviewed and stable  Last Vitals:  Vitals Value Taken Time  BP 112/62 04/22/2019  2:08 PM  Temp    Pulse 78 04/22/2019  2:12 PM  Resp 20 04/22/2019  2:12 PM  SpO2 100 % 04/22/2019  2:12 PM  Vitals shown include unvalidated device data.  Last Pain:  Vitals:   04/22/19 1227  TempSrc: Oral  PainSc: 0-No pain      Patients Stated Pain Goal: 0 (04/22/19 1227)  Complications: No apparent anesthesia complications

## 2019-04-22 NOTE — Anesthesia Procedure Notes (Signed)
Procedure Name: LMA Insertion Date/Time: 04/22/2019 1:37 PM Performed by: Burna Cash, CRNA Pre-anesthesia Checklist: Patient identified, Emergency Drugs available, Suction available and Patient being monitored Patient Re-evaluated:Patient Re-evaluated prior to induction Oxygen Delivery Method: Circle system utilized Preoxygenation: Pre-oxygenation with 100% oxygen Induction Type: IV induction Ventilation: Mask ventilation without difficulty LMA: LMA inserted LMA Size: 4.0 Number of attempts: 1 Airway Equipment and Method: Bite block Placement Confirmation: positive ETCO2 Tube secured with: Tape Dental Injury: Teeth and Oropharynx as per pre-operative assessment

## 2019-04-22 NOTE — Interval H&P Note (Signed)
History and Physical Interval Note:  04/22/2019 1:22 PM  Tasha Martin  has presented today for surgery, with the diagnosis of Blighted Ovum.  The various methods of treatment have been discussed with the patient and family. After consideration of risks, benefits and other options for treatment, the patient has consented to  Procedure(s): Suction DILATATION AND EVACUATION (N/A) as a surgical intervention.  The patient's history has been reviewed, patient examined, no change in status, stable for surgery.  I have reviewed the patient's chart and labs.  Questions were answered to the patient's satisfaction.     Dois Davenport A Tyreshia Ingman

## 2019-04-22 NOTE — Discharge Instructions (Signed)
POST-OPERATIVE INSTRUCTIONS TO PATIENT  Call [facility]  [phone])  for excessive pain, bleeding or temperature greater than or equal to 100.4 degrees (orally).    No driving for 24 hours No sexual activity for 1 week No vaginal tampon for 48 hours  Pain management:  Use Ibuprofen 600 mg every 6 hours for 5 days and then as needed.  Use Colace 1-2 capsules per day as long as you are using pain medication to avoid constipation.       Diet: normal  Bathing: may shower day of surgery  Wound Care: N/A  Return to Dr. Estanislado Pandy on : the office will call you to schedule post-operative visit    Crist Fat Rivard MD 04/22/2019 2:12 PM    Post Anesthesia Home Care Instructions  Activity: Get plenty of rest for the remainder of the day. A responsible individual must stay with you for 24 hours following the procedure.  For the next 24 hours, DO NOT: -Drive a car -Advertising copywriter -Drink alcoholic beverages -Take any medication unless instructed by your physician -Make any legal decisions or sign important papers.  Meals: Start with liquid foods such as gelatin or soup. Progress to regular foods as tolerated. Avoid greasy, spicy, heavy foods. If nausea and/or vomiting occur, drink only clear liquids until the nausea and/or vomiting subsides. Call your physician if vomiting continues.  Special Instructions/Symptoms: Your throat may feel dry or sore from the anesthesia or the breathing tube placed in your throat during surgery. If this causes discomfort, gargle with warm salt water. The discomfort should disappear within 24 hours.  If you had a scopolamine patch placed behind your ear for the management of post- operative nausea and/or vomiting:  1. The medication in the patch is effective for 72 hours, after which it should be removed.  Wrap patch in a tissue and discard in the trash. Wash hands thoroughly with soap and water. 2. You may remove the patch earlier than 72  hours if you experience unpleasant side effects which may include dry mouth, dizziness or visual disturbances. 3. Avoid touching the patch. Wash your hands with soap and water after contact with the patch.

## 2019-04-22 NOTE — Anesthesia Preprocedure Evaluation (Addendum)
Anesthesia Evaluation  Patient identified by MRN, date of birth, ID band Patient awake    Reviewed: Allergy & Precautions, NPO status , Patient's Chart, lab work & pertinent test results  Airway Mallampati: II  TM Distance: >3 FB Neck ROM: Full    Dental  (+) Teeth Intact, Dental Advisory Given   Pulmonary neg pulmonary ROS,    Pulmonary exam normal breath sounds clear to auscultation       Cardiovascular Exercise Tolerance: Good negative cardio ROS Normal cardiovascular exam Rhythm:Regular Rate:Normal     Neuro/Psych  Headaches, negative psych ROS   GI/Hepatic negative GI ROS, Neg liver ROS,   Endo/Other  negative endocrine ROSObesity   Renal/GU negative Renal ROS     Musculoskeletal negative musculoskeletal ROS (+)   Abdominal   Peds  Hematology negative hematology ROS (+)   Anesthesia Other Findings Day of surgery medications reviewed with the patient.  Reproductive/Obstetrics Blighted Ovum                             Anesthesia Physical Anesthesia Plan  ASA: II  Anesthesia Plan: General   Post-op Pain Management:    Induction: Intravenous  PONV Risk Score and Plan: 3 and Midazolam, Dexamethasone and Ondansetron  Airway Management Planned: LMA  Additional Equipment:   Intra-op Plan:   Post-operative Plan: Extubation in OR  Informed Consent: I have reviewed the patients History and Physical, chart, labs and discussed the procedure including the risks, benefits and alternatives for the proposed anesthesia with the patient or authorized representative who has indicated his/her understanding and acceptance.     Dental advisory given  Plan Discussed with: CRNA  Anesthesia Plan Comments:         Anesthesia Quick Evaluation

## 2019-04-22 NOTE — Anesthesia Postprocedure Evaluation (Signed)
Anesthesia Post Note  Patient: Tasha Martin  Procedure(s) Performed: Suction DILATATION AND EVACUATION (N/A Vagina )     Patient location during evaluation: PACU Anesthesia Type: General Level of consciousness: awake and alert Pain management: pain level controlled Vital Signs Assessment: post-procedure vital signs reviewed and stable Respiratory status: spontaneous breathing, nonlabored ventilation and respiratory function stable Cardiovascular status: blood pressure returned to baseline and stable Postop Assessment: no apparent nausea or vomiting Anesthetic complications: no    Last Vitals:  Vitals:   04/22/19 1442 04/22/19 1445  BP: 121/68 109/62  Pulse: 66 73  Resp: (!) 21 18  Temp:  37.1 C  SpO2: 100% 100%    Last Pain:  Vitals:   04/22/19 1445  TempSrc:   PainSc: 4                  Cecile Hearing

## 2019-04-25 ENCOUNTER — Encounter (HOSPITAL_BASED_OUTPATIENT_CLINIC_OR_DEPARTMENT_OTHER): Payer: Self-pay | Admitting: Obstetrics and Gynecology

## 2020-01-05 LAB — OB RESULTS CONSOLE GC/CHLAMYDIA
Chlamydia: NEGATIVE
Gonorrhea: NEGATIVE

## 2020-01-05 LAB — OB RESULTS CONSOLE RPR: RPR: NONREACTIVE

## 2020-01-05 LAB — OB RESULTS CONSOLE ABO/RH: "RH Type ": POSITIVE

## 2020-01-05 LAB — OB RESULTS CONSOLE ANTIBODY SCREEN: Antibody Screen: NEGATIVE

## 2020-01-05 LAB — OB RESULTS CONSOLE RUBELLA ANTIBODY, IGM: Rubella: IMMUNE

## 2020-01-05 LAB — OB RESULTS CONSOLE HIV ANTIBODY (ROUTINE TESTING): HIV: NONREACTIVE

## 2020-01-05 LAB — OB RESULTS CONSOLE HEPATITIS B SURFACE ANTIGEN: Hepatitis B Surface Ag: NEGATIVE

## 2020-01-31 ENCOUNTER — Other Ambulatory Visit (HOSPITAL_COMMUNITY): Payer: Self-pay | Admitting: Obstetrics and Gynecology

## 2020-01-31 DIAGNOSIS — Z3689 Encounter for other specified antenatal screening: Secondary | ICD-10-CM

## 2020-01-31 DIAGNOSIS — Z3A19 19 weeks gestation of pregnancy: Secondary | ICD-10-CM

## 2020-02-23 ENCOUNTER — Encounter (HOSPITAL_COMMUNITY): Payer: Self-pay | Admitting: *Deleted

## 2020-02-28 ENCOUNTER — Other Ambulatory Visit: Payer: Self-pay

## 2020-02-28 ENCOUNTER — Ambulatory Visit (HOSPITAL_COMMUNITY)
Admission: RE | Admit: 2020-02-28 | Discharge: 2020-02-28 | Disposition: A | Discharge status: Home / Self Care | Payer: 59 | Source: Ambulatory Visit | Attending: Obstetrics and Gynecology | Admitting: Obstetrics and Gynecology

## 2020-02-28 ENCOUNTER — Other Ambulatory Visit (HOSPITAL_COMMUNITY): Payer: Self-pay | Admitting: *Deleted

## 2020-02-28 DIAGNOSIS — Z363 Encounter for antenatal screening for malformations: Secondary | ICD-10-CM

## 2020-02-28 DIAGNOSIS — Z3A19 19 weeks gestation of pregnancy: Secondary | ICD-10-CM | POA: Diagnosis not present

## 2020-02-28 DIAGNOSIS — Z362 Encounter for other antenatal screening follow-up: Secondary | ICD-10-CM

## 2020-02-28 DIAGNOSIS — Z3689 Encounter for other specified antenatal screening: Secondary | ICD-10-CM | POA: Insufficient documentation

## 2020-03-28 ENCOUNTER — Ambulatory Visit (HOSPITAL_COMMUNITY)
Admission: RE | Admit: 2020-03-28 | Discharge: 2020-03-28 | Disposition: A | Discharge status: Home / Self Care | Payer: 59 | Source: Ambulatory Visit | Attending: Obstetrics and Gynecology | Admitting: Obstetrics and Gynecology

## 2020-03-28 ENCOUNTER — Other Ambulatory Visit: Payer: Self-pay

## 2020-03-28 ENCOUNTER — Ambulatory Visit (HOSPITAL_COMMUNITY): Payer: 59

## 2020-03-28 DIAGNOSIS — Z362 Encounter for other antenatal screening follow-up: Secondary | ICD-10-CM | POA: Diagnosis not present

## 2020-03-28 DIAGNOSIS — Z3A23 23 weeks gestation of pregnancy: Secondary | ICD-10-CM

## 2020-03-28 DIAGNOSIS — O34219 Maternal care for unspecified type scar from previous cesarean delivery: Secondary | ICD-10-CM | POA: Diagnosis not present

## 2020-03-28 DIAGNOSIS — O99212 Obesity complicating pregnancy, second trimester: Secondary | ICD-10-CM

## 2020-05-04 ENCOUNTER — Other Ambulatory Visit: Payer: Self-pay | Admitting: Obstetrics and Gynecology

## 2020-07-04 ENCOUNTER — Encounter (HOSPITAL_COMMUNITY): Payer: Self-pay

## 2020-07-05 ENCOUNTER — Telehealth (HOSPITAL_COMMUNITY): Payer: Self-pay | Admitting: *Deleted

## 2020-07-05 NOTE — Telephone Encounter (Signed)
Preadmission screen  

## 2020-07-10 ENCOUNTER — Encounter (HOSPITAL_COMMUNITY): Payer: Self-pay

## 2020-07-10 NOTE — Patient Instructions (Signed)
Tasha Martin  07/10/2020   Your procedure is scheduled on:  07/16/2020  Arrive at 0800 at Entrance C on [employer] [facility] at [facility]  and [address]. You are invited to use the FREE valet parking or use the Visitor's parking deck.  Pick up the phone at the desk and dial [phone].  Call this number if you have problems the morning of surgery: [phone]  Remember:   Do not eat food:(After Midnight) Desps de medianoche.  Do not drink clear liquids: (After Midnight) Desps de medianoche.  Take these medicines the morning of surgery with A SIP OF WATER:  none   Do not wear jewelry, make-up or nail polish.  Do not wear lotions, powders, or perfumes. Do not wear deodorant.  Do not shave 48 hours prior to surgery.  Do not bring valuables to the hospital.  [facility] is not   responsible for any belongings or valuables brought to the hospital.  Contacts, dentures or bridgework may not be worn into surgery.  Leave suitcase in the car. After surgery it may be brought to your room.  For patients admitted to the hospital, checkout time is 11:00 AM the day of              discharge.      Please read over the following fact sheets that you were given:     Preparing for Surgery

## 2020-07-14 ENCOUNTER — Other Ambulatory Visit: Payer: Self-pay

## 2020-07-14 ENCOUNTER — Other Ambulatory Visit (HOSPITAL_COMMUNITY)
Admission: RE | Admit: 2020-07-14 | Discharge: 2020-07-14 | Disposition: A | Discharge status: Home / Self Care | Payer: 59 | Source: Ambulatory Visit | Attending: Obstetrics & Gynecology | Admitting: Obstetrics & Gynecology

## 2020-07-14 DIAGNOSIS — Z20822 Contact with and (suspected) exposure to covid-19: Secondary | ICD-10-CM | POA: Insufficient documentation

## 2020-07-14 HISTORY — DX: Unspecified abnormal cytological findings in specimens from vagina: R87.629

## 2020-07-14 LAB — CBC
HCT: 35.7 % — ABNORMAL LOW (ref 36.0–46.0)
Hemoglobin: 11.5 g/dL — ABNORMAL LOW (ref 12.0–15.0)
MCH: 31.7 pg (ref 26.0–34.0)
MCHC: 32.2 g/dL (ref 30.0–36.0)
MCV: 98.3 fL (ref 80.0–100.0)
Platelets: 298 10*3/uL (ref 150–400)
RBC: 3.63 MIL/uL — ABNORMAL LOW (ref 3.87–5.11)
RDW: 12.7 % (ref 11.5–15.5)
WBC: 6.9 10*3/uL (ref 4.0–10.5)
nRBC: 0 % (ref 0.0–0.2)

## 2020-07-14 LAB — TYPE AND SCREEN
ABO/RH(D): O NEG
Antibody Screen: NEGATIVE
Weak D: POSITIVE

## 2020-07-14 LAB — RPR: RPR Ser Ql: NONREACTIVE

## 2020-07-14 LAB — SARS CORONAVIRUS 2 (TAT 6-24 HRS): SARS Coronavirus 2: NEGATIVE

## 2020-07-14 NOTE — MAU Note (Signed)
Pt here for swab and lab draw. Denies symptoms or sick contacts. Swab collected 

## 2020-07-16 ENCOUNTER — Encounter (HOSPITAL_COMMUNITY): Payer: Self-pay | Admitting: Obstetrics and Gynecology

## 2020-07-16 ENCOUNTER — Encounter (HOSPITAL_COMMUNITY)
Admission: RE | Disposition: A | Discharge status: Home / Self Care | Payer: Self-pay | Source: Home / Self Care | Attending: Obstetrics and Gynecology

## 2020-07-16 ENCOUNTER — Inpatient Hospital Stay (HOSPITAL_COMMUNITY)
Admission: RE | Admit: 2020-07-16 | Discharge: 2020-07-18 | DRG: 785 | Disposition: A | Discharge status: Home / Self Care | Payer: 59 | Attending: Obstetrics and Gynecology | Admitting: Obstetrics and Gynecology

## 2020-07-16 ENCOUNTER — Inpatient Hospital Stay (HOSPITAL_COMMUNITY): Payer: 59 | Admitting: Anesthesiology

## 2020-07-16 ENCOUNTER — Other Ambulatory Visit: Payer: Self-pay

## 2020-07-16 DIAGNOSIS — O9902 Anemia complicating childbirth: Secondary | ICD-10-CM | POA: Diagnosis present

## 2020-07-16 DIAGNOSIS — Z20822 Contact with and (suspected) exposure to covid-19: Secondary | ICD-10-CM | POA: Diagnosis present

## 2020-07-16 DIAGNOSIS — O99214 Obesity complicating childbirth: Secondary | ICD-10-CM | POA: Diagnosis present

## 2020-07-16 DIAGNOSIS — Z6791 Unspecified blood type, Rh negative: Secondary | ICD-10-CM

## 2020-07-16 DIAGNOSIS — D509 Iron deficiency anemia, unspecified: Secondary | ICD-10-CM | POA: Diagnosis present

## 2020-07-16 DIAGNOSIS — Z302 Encounter for sterilization: Secondary | ICD-10-CM

## 2020-07-16 DIAGNOSIS — Z98891 History of uterine scar from previous surgery: Secondary | ICD-10-CM

## 2020-07-16 DIAGNOSIS — O26893 Other specified pregnancy related conditions, third trimester: Secondary | ICD-10-CM | POA: Diagnosis present

## 2020-07-16 DIAGNOSIS — Z3A39 39 weeks gestation of pregnancy: Secondary | ICD-10-CM | POA: Diagnosis not present

## 2020-07-16 DIAGNOSIS — O34211 Maternal care for low transverse scar from previous cesarean delivery: Secondary | ICD-10-CM | POA: Diagnosis present

## 2020-07-16 SURGERY — Surgical Case
Anesthesia: Spinal | Laterality: Bilateral

## 2020-07-16 MED ORDER — PHENYLEPHRINE HCL-NACL 20-0.9 MG/250ML-% IV SOLN
INTRAVENOUS | Status: DC | PRN
  Administered 2020-07-16: 60 ug/min via INTRAVENOUS

## 2020-07-16 MED ORDER — LACTATED RINGERS IV SOLN: INTRAVENOUS | Status: DC

## 2020-07-16 MED ORDER — CEFAZOLIN SODIUM-DEXTROSE 2-3 GM-%(50ML) IV SOLR
INTRAVENOUS | Status: DC | PRN
  Administered 2020-07-16: 2 g via INTRAVENOUS

## 2020-07-16 MED ORDER — AMISULPRIDE (ANTIEMETIC) 5 MG/2ML IV SOLN
10.0000 mg | Freq: Once | INTRAVENOUS | Status: DC | PRN
  Filled 2020-07-16: qty 4

## 2020-07-16 MED ORDER — DIPHENHYDRAMINE HCL 25 MG PO CAPS: 25.0000 mg | ORAL_CAPSULE | ORAL | Status: DC | PRN

## 2020-07-16 MED ORDER — OXYCODONE HCL 5 MG/5ML PO SOLN: 5.0000 mg | Freq: Once | ORAL | Status: DC | PRN

## 2020-07-16 MED ORDER — ONDANSETRON HCL 4 MG/2ML IJ SOLN: 4.0000 mg | Freq: Once | INTRAMUSCULAR | Status: DC | PRN

## 2020-07-16 MED ORDER — DIPHENHYDRAMINE HCL 50 MG/ML IJ SOLN: 12.5000 mg | INTRAMUSCULAR | Status: DC | PRN

## 2020-07-16 MED ORDER — NALBUPHINE HCL 10 MG/ML IJ SOLN: 5.0000 mg | INTRAMUSCULAR | Status: DC | PRN

## 2020-07-16 MED ORDER — PHENYLEPHRINE 40 MCG/ML (10ML) SYRINGE FOR IV PUSH (FOR BLOOD PRESSURE SUPPORT)
PREFILLED_SYRINGE | INTRAVENOUS | Status: AC
  Filled 2020-07-16: qty 10

## 2020-07-16 MED ORDER — CEFAZOLIN SODIUM-DEXTROSE 2-4 GM/100ML-% IV SOLN: 2.0000 g | INTRAVENOUS | Status: DC

## 2020-07-16 MED ORDER — WITCH HAZEL-GLYCERIN EX PADS: 1.0000 "application " | MEDICATED_PAD | CUTANEOUS | Status: DC | PRN

## 2020-07-16 MED ORDER — SIMETHICONE 80 MG PO CHEW: 80.0000 mg | CHEWABLE_TABLET | ORAL | Status: DC | PRN

## 2020-07-16 MED ORDER — MEPERIDINE HCL 25 MG/ML IJ SOLN: 6.2500 mg | INTRAMUSCULAR | Status: DC | PRN

## 2020-07-16 MED ORDER — OXYTOCIN-SODIUM CHLORIDE 30-0.9 UT/500ML-% IV SOLN: 2.5000 [IU]/h | INTRAVENOUS | Status: AC

## 2020-07-16 MED ORDER — KETOROLAC TROMETHAMINE 30 MG/ML IJ SOLN
INTRAMUSCULAR | Status: AC
  Filled 2020-07-16: qty 1

## 2020-07-16 MED ORDER — BUPIVACAINE IN DEXTROSE 0.75-8.25 % IT SOLN
INTRATHECAL | Status: DC | PRN
  Administered 2020-07-16: 1.6 mL via INTRATHECAL

## 2020-07-16 MED ORDER — SIMETHICONE 80 MG PO CHEW
80.0000 mg | CHEWABLE_TABLET | ORAL | Status: DC
  Administered 2020-07-16 – 2020-07-17 (×2): 80 mg via ORAL
  Filled 2020-07-16 (×2): qty 1

## 2020-07-16 MED ORDER — SIMETHICONE 80 MG PO CHEW
80.0000 mg | CHEWABLE_TABLET | Freq: Three times a day (TID) | ORAL | Status: DC
  Administered 2020-07-16 – 2020-07-18 (×3): 80 mg via ORAL
  Filled 2020-07-16 (×5): qty 1

## 2020-07-16 MED ORDER — CEFAZOLIN SODIUM-DEXTROSE 2-4 GM/100ML-% IV SOLN
INTRAVENOUS | Status: AC
  Filled 2020-07-16: qty 100

## 2020-07-16 MED ORDER — ONDANSETRON HCL 4 MG/2ML IJ SOLN
INTRAMUSCULAR | Status: AC
  Filled 2020-07-16: qty 2

## 2020-07-16 MED ORDER — LACTATED RINGERS IV SOLN: INTRAVENOUS | Status: DC | PRN

## 2020-07-16 MED ORDER — FENTANYL CITRATE (PF) 100 MCG/2ML IJ SOLN
INTRAMUSCULAR | Status: DC | PRN
  Administered 2020-07-16: 15 ug via INTRATHECAL

## 2020-07-16 MED ORDER — RHO D IMMUNE GLOBULIN 1500 UNIT/2ML IJ SOSY
300.0000 ug | PREFILLED_SYRINGE | Freq: Once | INTRAMUSCULAR | Status: DC
  Filled 2020-07-16: qty 2

## 2020-07-16 MED ORDER — NALBUPHINE HCL 10 MG/ML IJ SOLN: 5.0000 mg | Freq: Once | INTRAMUSCULAR | Status: DC | PRN

## 2020-07-16 MED ORDER — METOCLOPRAMIDE HCL 5 MG/ML IJ SOLN
INTRAMUSCULAR | Status: AC
  Filled 2020-07-16: qty 2

## 2020-07-16 MED ORDER — SOD CITRATE-CITRIC ACID 500-334 MG/5ML PO SOLN
ORAL | Status: AC
  Filled 2020-07-16: qty 30

## 2020-07-16 MED ORDER — ACETAMINOPHEN 500 MG PO TABS
1000.0000 mg | ORAL_TABLET | Freq: Four times a day (QID) | ORAL | Status: AC
  Administered 2020-07-16 – 2020-07-17 (×3): 1000 mg via ORAL
  Filled 2020-07-16 (×3): qty 2

## 2020-07-16 MED ORDER — MORPHINE SULFATE (PF) 0.5 MG/ML IJ SOLN
INTRAMUSCULAR | Status: DC | PRN
  Administered 2020-07-16: 150 ug via INTRATHECAL

## 2020-07-16 MED ORDER — MENTHOL 3 MG MT LOZG: 1.0000 | LOZENGE | OROMUCOSAL | Status: DC | PRN

## 2020-07-16 MED ORDER — LACTATED RINGERS IV BOLUS: 1000.0000 mL | INTRAVENOUS | Status: DC | PRN

## 2020-07-16 MED ORDER — SODIUM CHLORIDE 0.9% FLUSH: 3.0000 mL | INTRAVENOUS | Status: DC | PRN

## 2020-07-16 MED ORDER — SENNOSIDES-DOCUSATE SODIUM 8.6-50 MG PO TABS
2.0000 | ORAL_TABLET | ORAL | Status: DC
  Administered 2020-07-16 – 2020-07-17 (×2): 2 via ORAL
  Filled 2020-07-16 (×2): qty 2

## 2020-07-16 MED ORDER — SOD CITRATE-CITRIC ACID 500-334 MG/5ML PO SOLN
30.0000 mL | ORAL | Status: AC
  Administered 2020-07-16: 30 mL via ORAL

## 2020-07-16 MED ORDER — KETOROLAC TROMETHAMINE 30 MG/ML IJ SOLN
30.0000 mg | Freq: Four times a day (QID) | INTRAMUSCULAR | Status: AC | PRN
  Administered 2020-07-16: 30 mg via INTRAVENOUS
  Filled 2020-07-16 (×2): qty 1

## 2020-07-16 MED ORDER — FENTANYL CITRATE (PF) 100 MCG/2ML IJ SOLN: 25.0000 ug | INTRAMUSCULAR | Status: DC | PRN

## 2020-07-16 MED ORDER — DIBUCAINE (PERIANAL) 1 % EX OINT: 1.0000 "application " | TOPICAL_OINTMENT | CUTANEOUS | Status: DC | PRN

## 2020-07-16 MED ORDER — SCOPOLAMINE 1 MG/3DAYS TD PT72
MEDICATED_PATCH | TRANSDERMAL | Status: AC
  Filled 2020-07-16: qty 1

## 2020-07-16 MED ORDER — ZOLPIDEM TARTRATE 5 MG PO TABS: 5.0000 mg | ORAL_TABLET | Freq: Every evening | ORAL | Status: DC | PRN

## 2020-07-16 MED ORDER — KETOROLAC TROMETHAMINE 30 MG/ML IJ SOLN: 30.0000 mg | Freq: Four times a day (QID) | INTRAMUSCULAR | Status: AC | PRN

## 2020-07-16 MED ORDER — MORPHINE SULFATE (PF) 0.5 MG/ML IJ SOLN
INTRAMUSCULAR | Status: AC
  Filled 2020-07-16: qty 10

## 2020-07-16 MED ORDER — NALOXONE HCL 4 MG/10ML IJ SOLN
1.0000 ug/kg/h | INTRAVENOUS | Status: DC | PRN
  Filled 2020-07-16: qty 5

## 2020-07-16 MED ORDER — OXYTOCIN-SODIUM CHLORIDE 30-0.9 UT/500ML-% IV SOLN
INTRAVENOUS | Status: DC | PRN
  Administered 2020-07-16: 400 mL via INTRAVENOUS

## 2020-07-16 MED ORDER — DEXAMETHASONE SODIUM PHOSPHATE 4 MG/ML IJ SOLN
INTRAMUSCULAR | Status: AC
  Filled 2020-07-16: qty 2

## 2020-07-16 MED ORDER — OXYCODONE-ACETAMINOPHEN 5-325 MG PO TABS
1.0000 | ORAL_TABLET | ORAL | Status: DC | PRN
  Administered 2020-07-18 (×2): 1 via ORAL
  Filled 2020-07-16 (×3): qty 1

## 2020-07-16 MED ORDER — ONDANSETRON HCL 4 MG/2ML IJ SOLN: 4.0000 mg | Freq: Three times a day (TID) | INTRAMUSCULAR | Status: DC | PRN

## 2020-07-16 MED ORDER — FENTANYL CITRATE (PF) 100 MCG/2ML IJ SOLN
INTRAMUSCULAR | Status: AC
  Filled 2020-07-16: qty 2

## 2020-07-16 MED ORDER — PRENATAL MULTIVITAMIN CH
1.0000 | ORAL_TABLET | Freq: Every day | ORAL | Status: DC
  Administered 2020-07-17 – 2020-07-18 (×2): 1 via ORAL
  Filled 2020-07-16 (×2): qty 1

## 2020-07-16 MED ORDER — DIPHENHYDRAMINE HCL 25 MG PO CAPS: 25.0000 mg | ORAL_CAPSULE | Freq: Four times a day (QID) | ORAL | Status: DC | PRN

## 2020-07-16 MED ORDER — NALOXONE HCL 0.4 MG/ML IJ SOLN: 0.4000 mg | INTRAMUSCULAR | Status: DC | PRN

## 2020-07-16 MED ORDER — OXYCODONE HCL 5 MG PO TABS: 5.0000 mg | ORAL_TABLET | Freq: Once | ORAL | Status: DC | PRN

## 2020-07-16 MED ORDER — SCOPOLAMINE 1 MG/3DAYS TD PT72
1.0000 | MEDICATED_PATCH | Freq: Once | TRANSDERMAL | Status: DC
  Administered 2020-07-16: 1.5 mg via TRANSDERMAL

## 2020-07-16 MED ORDER — DEXAMETHASONE SODIUM PHOSPHATE 4 MG/ML IJ SOLN
INTRAMUSCULAR | Status: DC | PRN
  Administered 2020-07-16: 4 mg via INTRAVENOUS

## 2020-07-16 MED ORDER — IBUPROFEN 800 MG PO TABS
800.0000 mg | ORAL_TABLET | Freq: Three times a day (TID) | ORAL | Status: DC | PRN
  Administered 2020-07-17 – 2020-07-18 (×4): 800 mg via ORAL
  Filled 2020-07-16 (×4): qty 1

## 2020-07-16 MED ORDER — ONDANSETRON HCL 4 MG/2ML IJ SOLN
INTRAMUSCULAR | Status: DC | PRN
  Administered 2020-07-16: 4 mg via INTRAVENOUS

## 2020-07-16 MED ORDER — COCONUT OIL OIL: 1.0000 "application " | TOPICAL_OIL | Status: DC | PRN

## 2020-07-16 MED ORDER — TETANUS-DIPHTH-ACELL PERTUSSIS 5-2.5-18.5 LF-MCG/0.5 IM SUSP: 0.5000 mL | Freq: Once | INTRAMUSCULAR | Status: DC

## 2020-07-16 SURGICAL SUPPLY — 39 items
APL SKNCLS STERI-STRIP NONHPOA (GAUZE/BANDAGES/DRESSINGS) ×1
BENZOIN TINCTURE PRP APPL 2/3 (GAUZE/BANDAGES/DRESSINGS) ×3 IMPLANT
CLAMP CORD UMBIL (MISCELLANEOUS) IMPLANT
CLOSURE WOUND 1/2 X4 (GAUZE/BANDAGES/DRESSINGS) ×1
CLOTH BEACON ORANGE TIMEOUT ST (SAFETY) ×3 IMPLANT
DRAIN JACKSON PRT FLT 10 (DRAIN) IMPLANT
DRSG OPSITE POSTOP 4X10 (GAUZE/BANDAGES/DRESSINGS) ×3 IMPLANT
ELECT REM PT RETURN 9FT ADLT (ELECTROSURGICAL) ×3
ELECTRODE REM PT RTRN 9FT ADLT (ELECTROSURGICAL) ×1 IMPLANT
EVACUATOR SILICONE 100CC (DRAIN) IMPLANT
EXTRACTOR VACUUM M CUP 4 TUBE (SUCTIONS) IMPLANT
EXTRACTOR VACUUM M CUP 4' TUBE (SUCTIONS)
GLOVE BIO SURGEON STRL SZ 6.5 (GLOVE) ×2 IMPLANT
GLOVE BIO SURGEONS STRL SZ 6.5 (GLOVE) ×1
GLOVE BIOGEL PI IND STRL 7.0 (GLOVE) ×2 IMPLANT
GLOVE BIOGEL PI INDICATOR 7.0 (GLOVE) ×4
GOWN STRL REUS W/TWL LRG LVL3 (GOWN DISPOSABLE) ×6 IMPLANT
KIT ABG SYR 3ML LUER SLIP (SYRINGE) IMPLANT
NDL HYPO 25X5/8 SAFETYGLIDE (NEEDLE) IMPLANT
NEEDLE HYPO 25X5/8 SAFETYGLIDE (NEEDLE) IMPLANT
NS IRRIG 1000ML POUR BTL (IV SOLUTION) ×3 IMPLANT
PACK C SECTION WH (CUSTOM PROCEDURE TRAY) ×3 IMPLANT
PAD OB MATERNITY 4.3X12.25 (PERSONAL CARE ITEMS) ×3 IMPLANT
PENCIL SMOKE EVAC W/HOLSTER (ELECTROSURGICAL) ×3 IMPLANT
RTRCTR C-SECT PINK 25CM LRG (MISCELLANEOUS) IMPLANT
STRIP CLOSURE SKIN 1/2X4 (GAUZE/BANDAGES/DRESSINGS) ×2 IMPLANT
SUT CHROMIC 0 CT 1 (SUTURE) ×3 IMPLANT
SUT MNCRL AB 3-0 PS2 27 (SUTURE) ×3 IMPLANT
SUT PLAIN 2 0 (SUTURE) ×6
SUT PLAIN 2 0 XLH (SUTURE) ×3 IMPLANT
SUT PLAIN ABS 2-0 CT1 27XMFL (SUTURE) ×2 IMPLANT
SUT SILK 2 0 SH (SUTURE) IMPLANT
SUT VIC AB 0 CTX 36 (SUTURE) ×12
SUT VIC AB 0 CTX36XBRD ANBCTRL (SUTURE) ×4 IMPLANT
SUT VIC AB 2-0 SH 27 (SUTURE)
SUT VIC AB 2-0 SH 27XBRD (SUTURE) IMPLANT
TOWEL OR 17X24 6PK STRL BLUE (TOWEL DISPOSABLE) ×3 IMPLANT
TRAY FOLEY W/BAG SLVR 14FR LF (SET/KITS/TRAYS/PACK) ×3 IMPLANT
WATER STERILE IRR 1000ML POUR (IV SOLUTION) ×3 IMPLANT

## 2020-07-16 NOTE — Op Note (Signed)
Cesarean Section Procedure Note   Tasha Martin  07/16/2020  Indications: Scheduled Proceedure/Maternal Request   Pre-operative Diagnosis: Prior Cesaran Section with desire for surgical sterilization.   Post-operative Diagnosis: Same   Procedure  Repeat CS LTCS WITH 2 LAYER CLOSURE AND  BTL  Surgeon Yader Criger  Assistants: Maggie ST  Anesthesia: spinal   Procedure Details:  The patient was seen in the Holding Room. The risks, benefits, complications, treatment options, and expected outcomes were discussed with the patient. The patient concurred with the proposed plan, giving informed consent. identified as Tasha Martin and the procedure verified as C-Section Delivery. A Time Out was held and the above information confirmed.  After induction of anesthesia, the patient was draped and prepped in the usual sterile manner. A transverse incision was made and carried down through the subcutaneous tissue to the fascia. Fascial incision was made and extended transversely. The fascia was separated from the underlying rectus tissue superiorly and inferiorly. The peritoneum was identified and entered. Peritoneal incision was extended longitudinally. The utero-vesical peritoneal reflection was incised transversely and the bladder flap was bluntly freed from the lower uterine segment. A low transverse uterine incision was made. Delivered from cephalic presentation was a  pound Living newborn infant(s) or Female with Apgar scores of 9 at one minute and 9 at five minutes. Cord ph was not sent the umbilical cord was clamped and cut cord blood was obtained for evaluation. The placenta was removed Intact and appeared normal. The uterine outline, tubes and ovaries appeared normal}. The uterine incision was closed with running locked sutures of 0Vicryl. A second layer of 0 vicryl was used to imbricate the uterus.  The patients left fallopian tube was grasped at the mid ishtmic portion with babcock clamp, ligated with  2-0 plain and excised.  The patients right fallopian tube was followed out to the fimbriated end.  The mid isthmic portion of the tube was ligated with 2-0 plain and excised.  Both portions of tubes was sent to pathology.     Hemostasis was observed. Lavage was carried out until clear. The fascia was then reapproximated with running sutures of 0Vicryl. The subcuticular closure was performed using 2-0 plain. The skin was closed with 3-40monocryl.   Instrument, sponge, and needle counts were correct prior the abdominal closure and were correct at the conclusion of the case. Wand was also done   Findings: normal abdominal and pelvic anatomy.  Normal appearing tubes B.  vtx presentation with clear fluid.  Some bladder adhesiona noted   Estimated Blood Loss 456  ml  Total IV Fluids:   Urine Output: 300CC OF clear urine  Specimens: no speciemen  Complications: no complications  Disposition: PACU - hemodynamically stable.   Maternal Condition: stable   Baby condition / location:  Couplet care / Skin to Skin  Attending Attestation: I performed the procedure.   Signed: Surgeon(s): Jaymes Graff, MD

## 2020-07-16 NOTE — Anesthesia Procedure Notes (Signed)
Spinal  Patient location during procedure: OR Staffing Performed: anesthesiologist  Anesthesiologist: Joliyah Lippens E, MD Preanesthetic Checklist Completed: patient identified, IV checked, risks and benefits discussed, surgical consent, monitors and equipment checked, pre-op evaluation and timeout performed Spinal Block Patient position: sitting Prep: DuraPrep and site prepped and draped Patient monitoring: continuous pulse ox, blood pressure and heart rate Approach: midline Location: L3-4 Injection technique: single-shot Needle Needle type: Pencan  Needle gauge: 25 G Needle length: 9 cm Additional Notes Functioning IV was confirmed and monitors were applied. Sterile prep and drape, including hand hygiene and sterile gloves were used. The patient was positioned and the spine was prepped. The skin was anesthetized with lidocaine.  Free flow of clear CSF was obtained prior to injecting local anesthetic into the CSF. The needle was carefully withdrawn. The patient tolerated the procedure well.      

## 2020-07-16 NOTE — Anesthesia Postprocedure Evaluation (Signed)
Anesthesia Post Note  Patient: Tasha Martin  Procedure(s) Performed: REPEAT CESAREAN SECTION WITH BILATERAL TUBAL LIGATION (Bilateral )     Patient location during evaluation: PACU Anesthesia Type: Spinal Level of consciousness: oriented and awake and alert Pain management: pain level controlled Vital Signs Assessment: post-procedure vital signs reviewed and stable Respiratory status: spontaneous breathing, respiratory function stable and nonlabored ventilation Cardiovascular status: blood pressure returned to baseline and stable Postop Assessment: no headache, no backache, no apparent nausea or vomiting and spinal receding Anesthetic complications: no   No complications documented.  Last Vitals:  Vitals:   07/16/20 1405 07/16/20 1600  BP: 117/65   Pulse: 69   Resp: 20 20  Temp: 36.6 C   SpO2: 98% 97%    Last Pain:  Vitals:   07/16/20 1640  TempSrc:   PainSc: 0-No pain   Pain Goal:                   Lucretia Kern

## 2020-07-16 NOTE — Transfer of Care (Signed)
Immediate Anesthesia Transfer of Care Note  Patient: Tasha Martin  Procedure(s) Performed: REPEAT CESAREAN SECTION WITH BILATERAL TUBAL LIGATION (Bilateral )  Patient Location: PACU  Anesthesia Type:Spinal  Level of Consciousness: awake, alert  and oriented  Airway & Oxygen Therapy: Patient Spontanous Breathing  Post-op Assessment: Report given to RN and Post -op Vital signs reviewed and stable  Post vital signs: Reviewed and stable  Last Vitals:  Vitals Value Taken Time  BP 120/56 07/16/20 1157  Temp    Pulse 61 07/16/20 1159  Resp 20 07/16/20 1159  SpO2 100 % 07/16/20 1159  Vitals shown include unvalidated device data.  Last Pain:  Vitals:   07/16/20 0811  TempSrc: Oral         Complications: No complications documented.

## 2020-07-16 NOTE — Anesthesia Preprocedure Evaluation (Signed)
Anesthesia Evaluation  Patient identified by MRN, date of birth, ID band Patient awake    Reviewed: Allergy & Precautions, H&P , NPO status , Patient's Chart, lab work & pertinent test results  History of Anesthesia Complications Negative for: history of anesthetic complications  Airway Mallampati: II  TM Distance: >3 FB Neck ROM: full    Dental no notable dental hx.    Pulmonary neg pulmonary ROS,    Pulmonary exam normal        Cardiovascular negative cardio ROS Normal cardiovascular exam     Neuro/Psych negative neurological ROS  negative psych ROS   GI/Hepatic negative GI ROS, Neg liver ROS,   Endo/Other  Morbid obesity  Renal/GU negative Renal ROS  negative genitourinary   Musculoskeletal   Abdominal   Peds  Hematology negative hematology ROS (+)   Anesthesia Other Findings  Prior C/S x1  Reproductive/Obstetrics (+) Pregnancy                             Anesthesia Physical Anesthesia Plan  ASA: III  Anesthesia Plan: Spinal   Post-op Pain Management:    Induction:   PONV Risk Score and Plan: Ondansetron and Treatment may vary due to age or medical condition  Airway Management Planned:   Additional Equipment:   Intra-op Plan:   Post-operative Plan:   Informed Consent: I have reviewed the patients History and Physical, chart, labs and discussed the procedure including the risks, benefits and alternatives for the proposed anesthesia with the patient or authorized representative who has indicated his/her understanding and acceptance.       Plan Discussed with:   Anesthesia Plan Comments:         Anesthesia Quick Evaluation

## 2020-07-16 NOTE — H&P (Addendum)
OB ADMISSION/ HISTORY & PHYSICAL:  Admission Date: 07/16/2020  7:54 AM  Admit Diagnosis: Repeat Cesarean Section w/ bilat salpingectomy  Tasha Martin is a 34 y.o. female [phone] [redacted]w[redacted]d presenting for repeat C/S.   History of current pregnancy: [id number]   Patient entered care with [facility] at 11+5 wks.   EDC of 07/21/20 was established by U/S @ 11+5 wk Anatomy scan:  with normal findings    Antenatal testing: none required  Significant prenatal events:  Patient Active Problem List   Diagnosis Date Noted   Previous cesarean section 07/16/2020    Priority: Medium   Hx of abnormal cervical Pap smear had cryo in 2011 11/23/2011   Rh negative, maternal 11/23/2011    Prenatal Labs: ABO, Rh: --/--/O NEG (08/07 0857) Antibody: NEG (08/07 0857) Rubella: Immune (01/28 0000)  RPR: NON REACTIVE (08/07 0857)  HBsAg: Negative (01/28 0000)  HIV: Non-reactive (01/28 0000)  GTT: passed 1 hr GBS:   negative GC/CHL: neg/neg Genetics: declines Tdap/influenza vaccines: tdap current/ declined flu   OB History  Gravida Para Term Preterm AB Living  4 1 1  0 2 1  SAB TAB Ectopic Multiple Live Births  2 0 0 0 1    # Outcome Date GA Lbr Len/2nd Weight Sex Delivery Anes PTL Lv  4 Current           3 SAB 12/2018          2 Term 11/23/11 [redacted]w[redacted]d  3359 g F CS-LTranv EPI  LIV  1 SAB             Medical / Surgical History: Past medical history:  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   Hx: UTI (urinary tract infection)    Frequently during childhood    Pain pelvic 01/2005   Vaginal Pap smear, abnormal     Past surgical history:  Past Surgical History:  Procedure Laterality Date   CESAREAN SECTION   11/23/2011   Procedure: CESAREAN SECTION;  Surgeon: 11/25/2011, MD;  Location: [facility];  Service: Gynecology;  Laterality: N/A;  primary cesarean section of baby girl at 2248  APGAR  9/9   CRYOABLATION     DILATION AND EVACUATION N/A 12/17/2018   Procedure: SUCTION DILATATION AND EVACUATION;  Surgeon: 02/15/2019, MD;  Location: [facility];  Service: Gynecology;  Laterality: N/A;  with Ultrasound   DILATION AND EVACUATION N/A 04/22/2019   Procedure: Suction DILATATION AND EVACUATION;  Surgeon: 04/24/2019, MD;  Location: [facility];  Service: Gynecology;  Laterality: N/A;   MOUTH SURGERY  2011   WISDOM TOOTH EXTRACTION  2011   WRIST SURGERY     Family History:  Family History  Problem Relation Age of Onset   Hypertension Mother    Hypertension Father    COPD Father    Heart disease Father    Arthritis Maternal Grandmother    Diabetes Maternal Grandmother    Hypertension Maternal Grandmother    Arthritis Maternal Grandfather    Diabetes Maternal Grandfather    Hypertension Maternal Grandfather    Hypertension Paternal Grandmother    Hypertension Paternal 2012  Cancer Maternal Aunt        ovarian   Alcohol abuse Maternal Uncle     Social History:  reports that she has never smoked. She has never used smokeless tobacco. She reports previous alcohol use. She reports that she does not use drugs.  Allergies: Patient has no known allergies.   Current Medications at time of admission:  Prior to Admission medications   Not on File    Review of Systems: Constitutional: Negative   HENT: Negative   Eyes: Negative   Respiratory: Negative   Cardiovascular: Negative   Gastrointestinal: Negative  Genitourinary: neg for bloody show, neg for LOF   Musculoskeletal: Negative   Skin: Negative   Neurological: Negative   Endo/Heme/Allergies: Negative   Psychiatric/Behavioral: Negative    Physical Exam: VS: Blood pressure 136/79,  pulse 77, temperature 98.5 F (36.9 C), temperature source Oral, resp. rate 16, height 5\' 5"  (1.651 m), weight 111.6 kg, last menstrual period 10/23/2019, SpO2 100 %, unknown if currently breastfeeding.     Prenatal Transfer Tool  Maternal Diabetes: No Genetic Screening: Declined Maternal Ultrasounds/Referrals: Normal Fetal Ultrasounds or other Referrals:  None Maternal Substance Abuse:  No Significant Maternal Medications:  None Significant Maternal Lab Results: Group B Strep negative and Rh negative    Assessment: 34 y.o. 20 [redacted]w[redacted]d Repeat C/ w/ BTL Rh negative  FHR category 1 GBS negative Pain management plan: per anesthesia   Plan:  Admit to L&D Routine admission orders Rhogam PP  Dr [redacted]w[redacted]d aware of admission  Normand Sloop MSN, CNM 07/16/2020 8:23 AM

## 2020-07-16 NOTE — Lactation Note (Signed)
This note was copied from a baby's chart. Lactation Consultation Note  Patient Name: Tasha Martin [id number] Date: 07/16/2020 Reason for consult: Initial assessment;1st time breastfeeding;Term P1, 5 hour term female infant. Per mom, infant previously was not sustaining latch. Per mom, she did not BF her 34 year old daughter. Per mom, she attempt latch infant earlier but infant will not open mouth or sustain latch. Tools given: hand pump to pre-pump breast prior to latching infant, mom has short shaft nipples. Mom taught hand expression and expressed 1 ml of colostrum that was finger fed to infant by LC gloved finger prior to latching infant. Mom latched infant on her right breast using the football hold position, mom brought infant towards her chest, infant latched with wide mouth, nose and chin touching breast and top lip flanged outward. Infant was still breastfeeding after 20 minutes when LC left the room. Parent will continue to do STS with infant. Mom knows to BF according to cues, on demand, 8 to 12+ times within 24 hours. Mom knows to call RN or LC if she needs further assistance with latching infant at breast.  Mom made aware of O/P services, breastfeeding support groups, community resources, and our phone # for post-discharge questions.   Maternal Data Formula Feeding for Exclusion: No Has patient been taught Hand Expression?: Yes Does the patient have breastfeeding experience prior to this delivery?: No  Feeding Feeding Type: Breast Fed  LATCH Score Latch: Grasps breast easily, tongue down, lips flanged, rhythmical sucking.  Audible Swallowing: Spontaneous and intermittent  Type of Nipple: Everted at rest and after stimulation (short shafted)  Comfort (Breast/Nipple): Soft / non-tender  Hold (Positioning): Assistance needed to correctly position infant at breast and maintain latch.  LATCH Score: 9  Interventions Interventions: Breast feeding basics reviewed;Breast  compression;Assisted with latch;Skin to skin;Adjust position;Support pillows;Breast massage;Hand pump;Position options;Hand express;Expressed milk;Pre-pump if needed  Lactation Tools Discussed/Used Tools: Pump Breast pump type: Manual WIC Program: No Pump Review: Setup, frequency, and cleaning;Milk Storage Initiated by:: Danelle Earthly, IBCLC Date initiated:: 07/16/20   Consult Status Consult Status: Follow-up Date: 07/17/20 Follow-up type: In-patient    Danelle Earthly 07/16/2020, 4:43 PM

## 2020-07-17 DIAGNOSIS — O9902 Anemia complicating childbirth: Secondary | ICD-10-CM

## 2020-07-17 LAB — CBC
HCT: 28.7 % — ABNORMAL LOW (ref 36.0–46.0)
Hemoglobin: 9.3 g/dL — ABNORMAL LOW (ref 12.0–15.0)
MCH: 32 pg (ref 26.0–34.0)
MCHC: 32.4 g/dL (ref 30.0–36.0)
MCV: 98.6 fL (ref 80.0–100.0)
Platelets: 259 10*3/uL (ref 150–400)
RBC: 2.91 MIL/uL — ABNORMAL LOW (ref 3.87–5.11)
RDW: 12.8 % (ref 11.5–15.5)
WBC: 11 10*3/uL — ABNORMAL HIGH (ref 4.0–10.5)
nRBC: 0 % (ref 0.0–0.2)

## 2020-07-17 LAB — BIRTH TISSUE RECOVERY COLLECTION (PLACENTA DONATION)

## 2020-07-17 LAB — SURGICAL PATHOLOGY

## 2020-07-17 MED ORDER — POLYSACCHARIDE IRON COMPLEX 150 MG PO CAPS
150.0000 mg | ORAL_CAPSULE | Freq: Every day | ORAL | Status: DC
  Administered 2020-07-17 – 2020-07-18 (×2): 150 mg via ORAL
  Filled 2020-07-17 (×2): qty 1

## 2020-07-17 MED ORDER — MAGNESIUM OXIDE 400 (241.3 MG) MG PO TABS
400.0000 mg | ORAL_TABLET | Freq: Every day | ORAL | Status: DC
  Administered 2020-07-17 – 2020-07-18 (×2): 400 mg via ORAL
  Filled 2020-07-17 (×2): qty 1

## 2020-07-17 NOTE — Lactation Note (Addendum)
This note was copied from a baby's chart. Lactation Consultation Note  Patient Name: [name] [id number] Date: 07/17/2020 Reason for consult: Follow-up assessment;Difficult latch   P1, Baby 25 hours old.   Infant has not breastfed since 0500 and baby is still having trouble sustaining latch.  Baby sleepy after circ.  Assisted with latching in football hold but infant came off and on breast. Applied #24NS and baby was able to sustain latch.   Encouraged mother to continue to try without NS but can use if she is struggling. Heather RN to set up DEBP.  Reviewed spray use for cleaning and how to use curved tip syringe to prefill NS.   Returned to room. Heather RN had brought in DEBP.  Reviewed use. Mother trying to latch infant in football hold on R side.  Baby off and on breast. Applied #24NS to help infant sustain latch with short shaft nipples. Noted short mid posterior lingual frenulum.  Noted labial frenulum.  Upper lip blanches when flanged. Encouraged mother to post pump every other feeding and give back volume on spoon or in NS with curved tip syringe.    Maternal Data    Feeding Feeding Type: Breast Milk  LATCH Score Latch: Repeated attempts needed to sustain latch, nipple held in mouth throughout feeding, stimulation needed to elicit sucking reflex.  Audible Swallowing: A few with stimulation  Type of Nipple: Everted at rest and after stimulation  Comfort (Breast/Nipple): Soft / non-tender  Hold (Positioning): Assistance needed to correctly position infant at breast and maintain latch.  LATCH Score: 7  Interventions Interventions: Breast feeding basics reviewed;Assisted with latch;Skin to skin;Hand express;Breast compression;Adjust position;Support pillows;Position options  Lactation Tools Discussed/Used Tools: Nipple Shields Nipple shield size: 24 Breast pump type: Double-Electric Breast Pump   Consult Status Consult Status: Follow-up Date:  07/18/20 Follow-up type: In-patient    [name] [facility] 07/17/2020, 12:39 PM

## 2020-07-17 NOTE — Progress Notes (Signed)
Subjective: POD# 1 Live born female  Birth Weight: 8 lb 2 oz (3685 g) APGAR: 8, 9  Newborn Delivery   Birth date/time: 07/16/2020 10:50:00 Delivery type: C-Section, Low Transverse Trial of labor: No C-section categorization: Repeat     Baby name: Tasha Martin Delivering provider: Jaymes Graff   Circumcision: Yes, planning inpatient Feeding: breast  Pain control at delivery: Spinal   Reports feeling well, pain is minimal. No complaints.  Patient reports tolerating PO.   Breast symptoms:None Pain controlled with acetaminophen, ibuprofen (OTC) and narcotic analgesics including Oxy IR Denies HA/SOB/C/P/N/V/dizziness. Flatus Yes. She reports vaginal bleeding as normal, without clots.  She is ambulating, urinating without difficulty.     Objective:   VS:    Vitals:   07/16/20 1804 07/16/20 1912 07/16/20 2315 07/17/20 0315  BP: (!) 107/55  129/62 (!) 111/59  Pulse: 68  66 63  Resp: 18 16 15 16   Temp: 98.4 F (36.9 C) 98.5 F (36.9 C) 98.5 F (36.9 C) 98.3 F (36.8 C)  TempSrc: Oral Oral Oral Oral  SpO2: 98% 99% 100% 100%  Weight:      Height:        Intake/Output Summary (Last 24 hours) at 07/17/2020 1005 Last data filed at 07/17/2020 0315 Gross per 24 hour  Intake 3546.05 ml  Output 2506 ml  Net 1040.05 ml      Recent Labs    07/17/20 0535  WBC 11.0*  HGB 9.3*  HCT 28.7*  PLT 259    Blood type: --/--/O NEG (08/07 0857)  Rubella: Immune (01/28 0000)    Physical Exam:  General: alert, cooperative and appears stated age CV: Regular rate and rhythm Resp: clear Abdomen: soft, nontender, normal bowel sounds Incision: Pressure dressing present, no drainage noted Uterine Fundus: firm, below umbilicus, nontender Lochia: minimal and moderate Ext: extremities normal, atraumatic, no cyanosis or edema  Assessment/Plan: 34 y.o.   POD# 1. 20                  Principal Problem:   Postpartum care following cesarean delivery 8/9 Active Problems:   Rh negative,  maternal   Previous cesarean section   S/P cesarean section (8/9)   Maternal anemia, with delivery  Doing well, stable.    Hemoglobin this AM is 9.3. Will start on Niferex and mag oxide.    Advance diet as tolerated Encourage rest when baby rests Breastfeeding support Encourage to ambulate Routine post-op care Anticipate discharge tomorrow  34-17-2000, CNM, MSN 07/17/2020, 10:05 AM

## 2020-07-18 MED ORDER — OXYCODONE-ACETAMINOPHEN 5-325 MG PO TABS: 1.0000 | ORAL_TABLET | ORAL | 0 refills | Status: DC | PRN

## 2020-07-18 MED ORDER — IBUPROFEN 800 MG PO TABS: 800.0000 mg | ORAL_TABLET | Freq: Three times a day (TID) | ORAL | 0 refills | Status: DC | PRN

## 2020-07-18 NOTE — Lactation Note (Signed)
This note was copied from a baby's chart. Lactation Consultation Note  Patient Name: Tasha Martin ACZYS'A Date: 07/18/2020 Reason for consult: Follow-up assessment;Infant weight loss;Term  Visited with mom of a 41 hours old FT female, she's a P2 but this is her 1st time BF. Mom and baby are going home today, baby is at 7% weight loss. Mom has been set up with a DEBP but she hasn't been pumping or supplementing her EBM consistently. She told LC that BF has improved since they offer the pacifier to baby yesterday. Explained to parents the risk of offering a pacifier and how they could hurt BF on the long term.  Since this baby is already at 7% weight loss, pacifier use is not recommended. Reviewed supplementation guidelines with parents, mom was advised to supplement with her own EBM and offer it to baby, but parents anxious about offering formula in case mom is not pumping enough EBM. Encouraged mom to pump after feedings; and feed baby on cues 8-12 times/24 hours. Parent will supplement with EBM/formula at home to prevent further weight loss.   Reviewed discharge instructions, engorgement prevention and treatment, treatment/prevention for sore nipples and red flags on when to call baby's doctor, his appt is on Friday August 13th, two days from now.Dad present and supportive. Parents reported all questions and concerns were answered, they're both aware of LC OP services and will contact PRN.   Maternal Data    Feeding    LATCH Score                   Interventions Interventions: Breast feeding basics reviewed  Lactation Tools Discussed/Used Tools: Pump Breast pump type: Double-Electric Breast Pump (not using it as often as indicated)   Consult Status Consult Status: Complete Date: 07/18/20 Follow-up type: Call as needed    Kale Rondeau Venetia Constable 07/18/2020, 9:37 AM

## 2020-07-18 NOTE — Lactation Note (Signed)
This note was copied from a baby's chart. Lactation Consultation Note  Patient Name: Tasha Martin Today's Date: 07/18/2020  P1, 38 hour term infant . LC entered the room mom and infant asleep.   Maternal Data    Feeding Feeding Type: Breast Fed  LATCH Score                   Interventions    Lactation Tools Discussed/Used     Consult Status      Danelle Earthly 07/18/2020, 1:05 AM

## 2020-07-18 NOTE — Lactation Note (Signed)
This note was copied from a baby's chart. Lactation Consultation Note  Patient Name: Tasha Martin UPJSR'P Date: 07/18/2020  P1, 40 hour term female infant -3% weight loss. Per mom, infant is latching without NS now most feedings are 20 to 30 minutes. Mom doesn't have any questions or concerns for LC at this time. Mom will continue to BF infant according to cues, on demand , 8 to 12+ times within 24 hours. Parents are continue to do STS with infant. Mom knows to call RN or LC if she needs any assistance with latching infant  at breast.  Per mom, she has been using DEBP every 3 hours as previously advised by RN and LC services.  Infant does have short mid posterior lingual frenulum.  Maternal Data    Feeding    LATCH Score                   Interventions    Lactation Tools Discussed/Used     Consult Status      Danelle Earthly 07/18/2020, 3:12 AM

## 2020-07-18 NOTE — Discharge Summary (Signed)
   Postpartum Discharge Summary      Patient Name: Tasha Martin DOB: 07/13/1986 MRN: [id number]  Date of admission: 07/16/2020 Delivery date:07/16/2020  Delivering provider: DILLARD, NAIMA  Date of discharge: 07/18/2020  Admitting diagnosis: Previous cesarean section [Z98.891] S/P cesarean section [Z98.891] Intrauterine pregnancy: [redacted]w[redacted]d     Secondary diagnosis:  Principal Problem:   Postpartum care following cesarean delivery 8/9 Active Problems:   Rh negative, maternal   Previous cesarean section   S/P cesarean section (8/9)   Maternal anemia, with delivery  Additional problems: NA    Discharge diagnosis: Term Pregnancy Delivered                                              Post partum procedures:postpartum tubal ligation Augmentation: N/A Complications: None  Hospital course: Sceduled C/S   34 y.o. yo G4P2022 at [redacted]w[redacted]d was admitted to the hospital 07/16/2020 for scheduled cesarean section with the following indication:Elective Repeat.Delivery details are as follows:  Membrane Rupture Time/Date: 10:50 AM ,07/16/2020   Delivery Method:C-Section, Low Transverse  Details of operation can be found in separate operative note.  Patient had an uncomplicated postpartum course.  She is ambulating, tolerating a regular diet, passing flatus, and urinating well. Patient is discharged home in stable condition on  07/18/20        Newborn Data: Birth date:07/16/2020  Birth time:10:50 AM  Gender:Female  Living status:Living  Apgars:8 ,9  Weight:3685 g     Magnesium Sulfate received: No BMZ received: No Rhophylac:No MMR:No T-DaP:Given prenatally Flu: No Transfusion:No  Physical exam  Vitals:   07/17/20 0315 07/17/20 1500 07/17/20 2206 07/18/20 0507  BP: (!) 111/59 (!) 101/55 117/66 118/75  Pulse: 63 66 65 (!) 101  Resp: 16 20 19 18  Temp: 98.3 F (36.8 C) 98.1 F (36.7 C) 98 F (36.7 C) 98.4 F (36.9 C)  TempSrc: Oral Oral Oral Oral  SpO2: 100%  100% 97%  Weight:      Height:        General: alert, cooperative and no distress Lochia: appropriate Uterine Fundus: firm Incision: Dressing is clean, dry, and intact. RN to remove prior to DC DVT Evaluation: No evidence of DVT seen on physical exam. Negative Homan's sign. No cords or calf tenderness. No significant calf/ankle edema. Labs: Lab Results  Component Value Date   WBC 11.0 (H) 07/17/2020   HGB 9.3 (L) 07/17/2020   HCT 28.7 (L) 07/17/2020   MCV 98.6 07/17/2020   PLT 259 07/17/2020   No flowsheet data found. Edinburgh Score: Edinburgh Postnatal Depression Scale Screening Tool 07/17/2020  I have been able to laugh and see the funny side of things. 0  I have looked forward with enjoyment to things. 1  I have blamed myself unnecessarily when things went wrong. 0  I have been anxious or worried for no good reason. 1  I have felt scared or panicky for no good reason. 1  Things have been getting on top of me. 1  I have been so unhappy that I have had difficulty sleeping. 0  I have felt sad or miserable. 0  I have been so unhappy that I have been crying. 0  The thought of harming myself has occurred to me. 0  Edinburgh Postnatal Depression Scale Total 4      After visit meds:  Allergies as of 07/18/2020     No Known Allergies     Medication List    TAKE these medications   ibuprofen 800 MG tablet Commonly known as: ADVIL Take 1 tablet (800 mg total) by mouth every 8 (eight) hours as needed for moderate pain.   oxyCODONE-acetaminophen 5-325 MG tablet Commonly known as: PERCOCET/ROXICET Take 1-2 tablets by mouth every 4 (four) hours as needed for moderate pain.        Discharge home in stable condition Infant Feeding: Breast Infant Disposition:home with mother Discharge instruction: per After Visit Summary and Postpartum booklet. Activity: Advance as tolerated. Pelvic rest for 6 weeks.  Diet: routine diet Anticipated Birth Control: BTL done PP Postpartum Appointment:6 weeks Additional  Postpartum F/U: NA Future Appointments:No future appointments. Follow up Visit:  Follow-up Information    [facility], [facility] Follow up in 6 week(s).   Specialty: Obstetrics and Gynecology Contact information: [address] [place] [address] [phone]                   07/18/2020 Jennifer B Crumpler, CNM  

## 2020-08-03 ENCOUNTER — Other Ambulatory Visit (HOSPITAL_BASED_OUTPATIENT_CLINIC_OR_DEPARTMENT_OTHER): Payer: Self-pay | Admitting: Family Medicine

## 2020-08-03 ENCOUNTER — Ambulatory Visit (HOSPITAL_BASED_OUTPATIENT_CLINIC_OR_DEPARTMENT_OTHER)
Admission: RE | Admit: 2020-08-03 | Discharge: 2020-08-03 | Disposition: A | Discharge status: Home / Self Care | Payer: 59 | Source: Ambulatory Visit | Attending: Family Medicine | Admitting: Family Medicine

## 2020-08-03 ENCOUNTER — Other Ambulatory Visit: Payer: Self-pay

## 2020-08-03 DIAGNOSIS — M79661 Pain in right lower leg: Secondary | ICD-10-CM | POA: Diagnosis present

## 2020-10-29 ENCOUNTER — Other Ambulatory Visit: Payer: 59

## 2020-10-29 DIAGNOSIS — Z20822 Contact with and (suspected) exposure to covid-19: Secondary | ICD-10-CM

## 2020-10-30 LAB — NOVEL CORONAVIRUS, NAA: SARS-CoV-2, NAA: NOT DETECTED

## 2020-10-30 LAB — SARS-COV-2, NAA 2 DAY TAT

## 2021-02-06 IMAGING — US US EXTREM LOW VENOUS*R*
1 series · 14 of 24 positions shown · non-contrast
Comparison: None.

CLINICAL DATA: Right lateral calf pain.  Two weeks postpartum.

EXAM:
RIGHT LOWER EXTREMITY VENOUS DOPPLER ULTRASOUND
TECHNIQUE: Gray-scale sonography with compression, as well as color and duplex
ultrasound, were performed to evaluate the deep venous system(s)
from the level of the common femoral vein through the popliteal and
proximal calf veins.

[Series 1: us extrem low venous*right* · 14 of 34 slices shown]
[im 1/34]
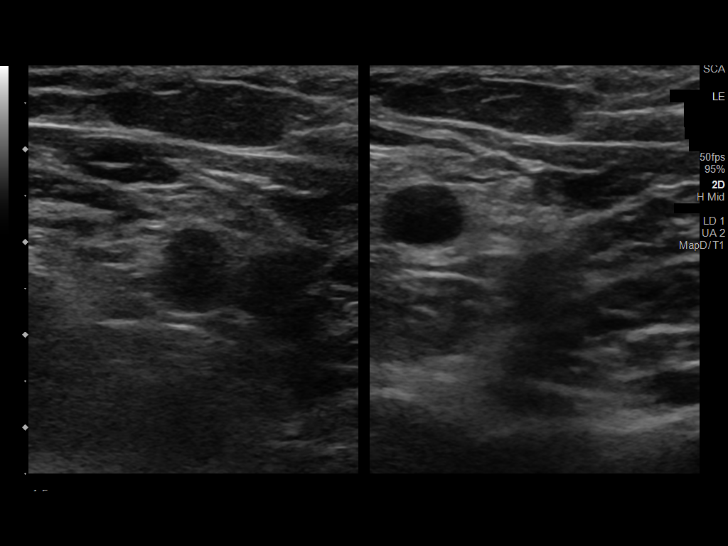
[im 3/34]
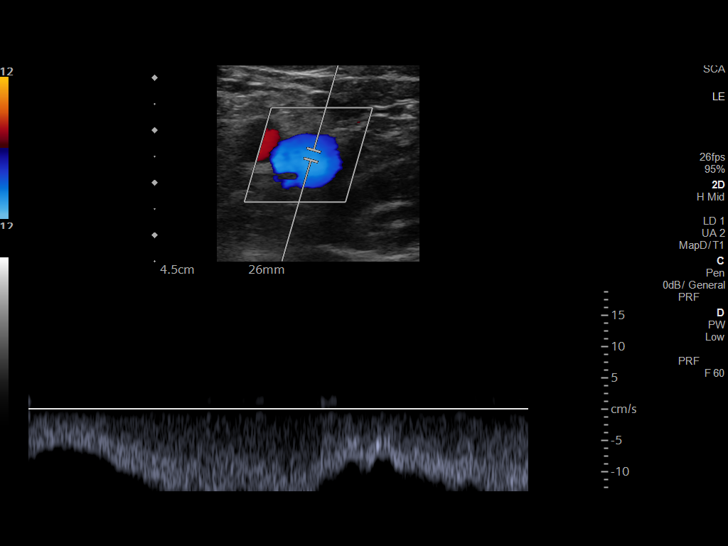
[im 6/34]
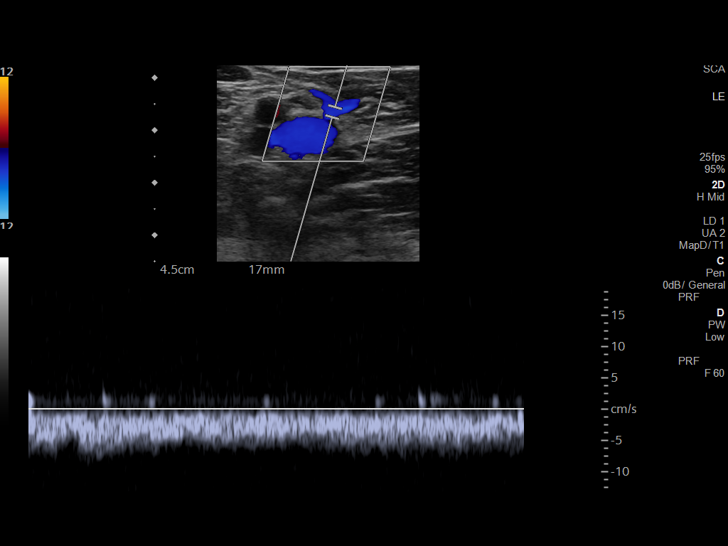
[im 9/34]
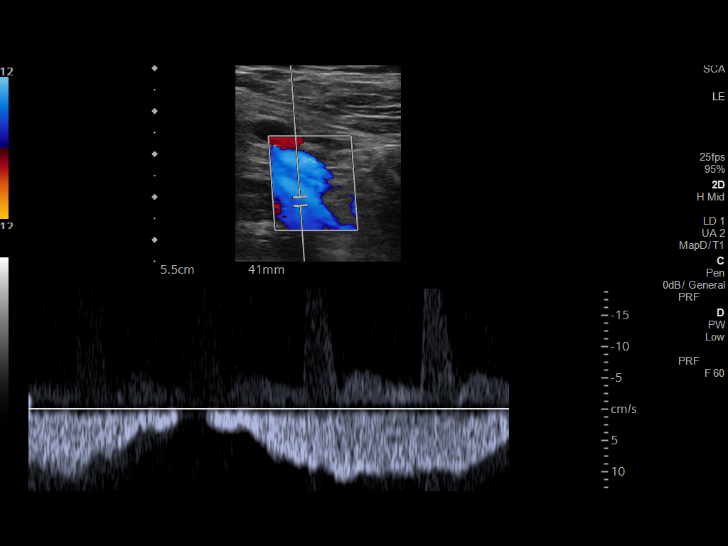
[im 11/34]
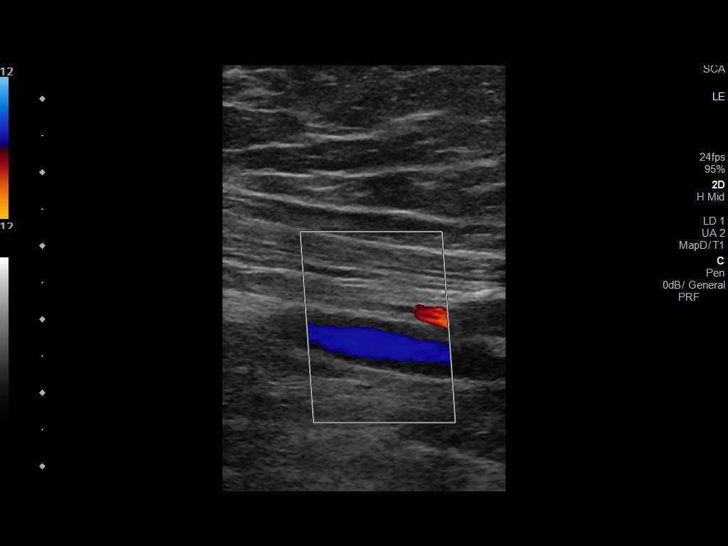
[im 13/34]
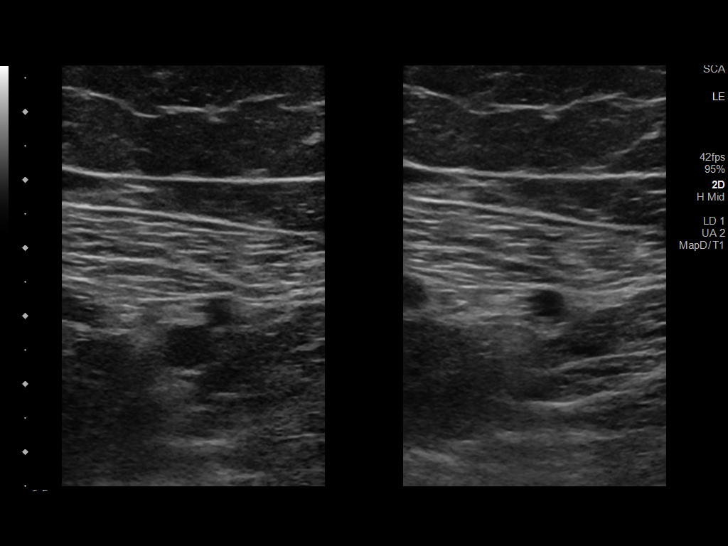
[im 16/34]
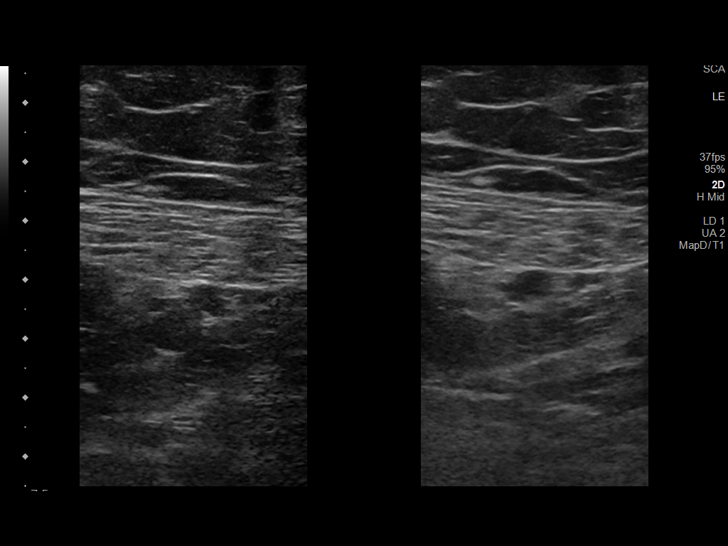
[im 18/34]
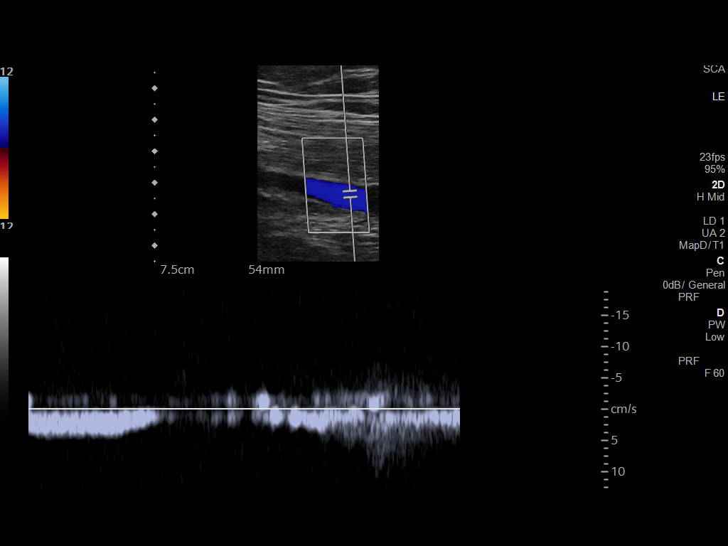
[im 21/34]
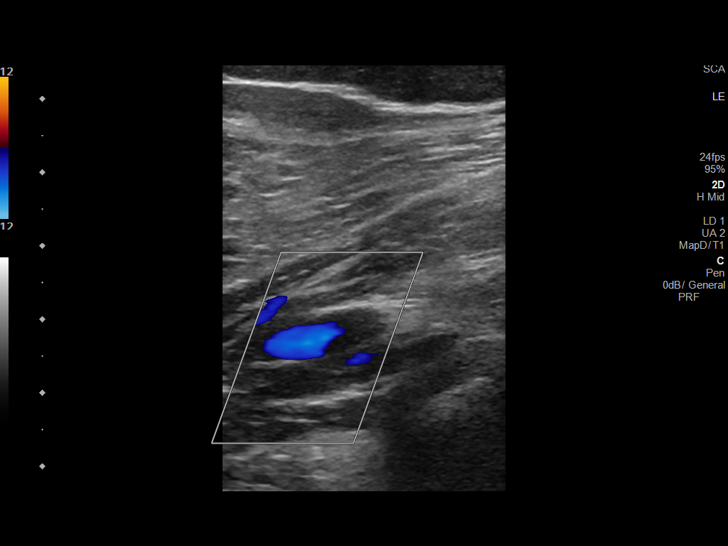
[im 23/34]
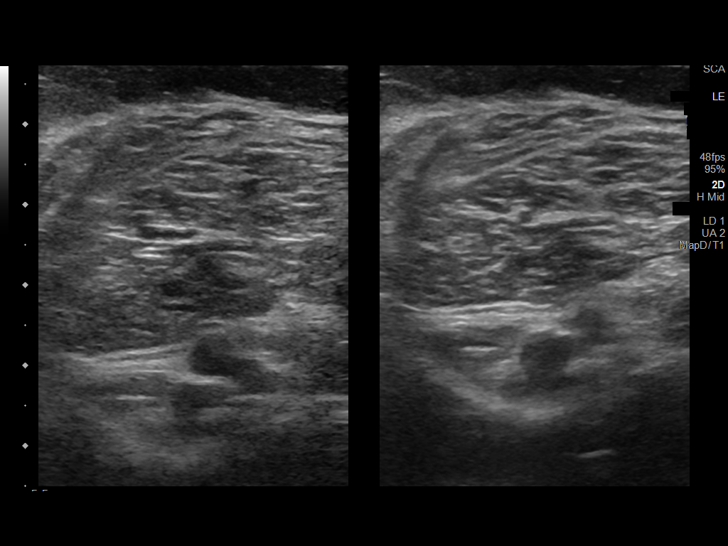
[im 26/34]
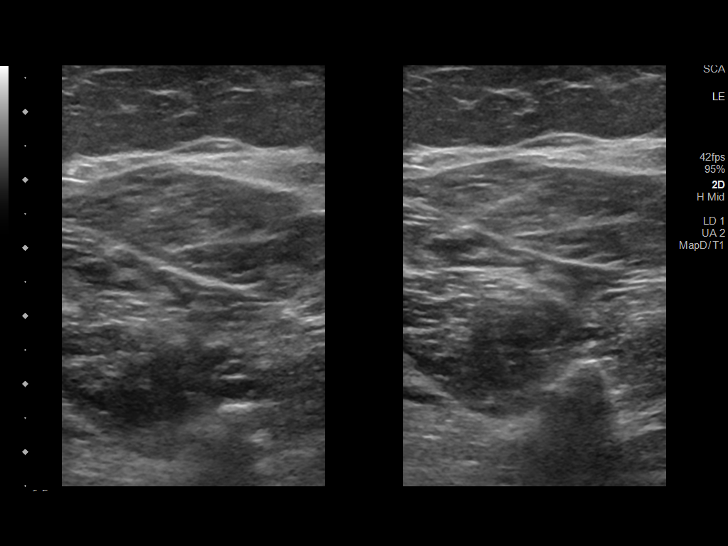
[im 28/34]
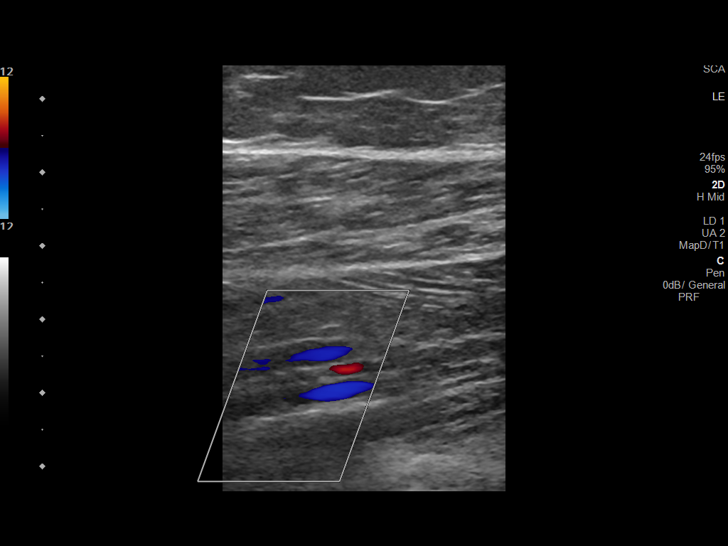
[im 31/34]
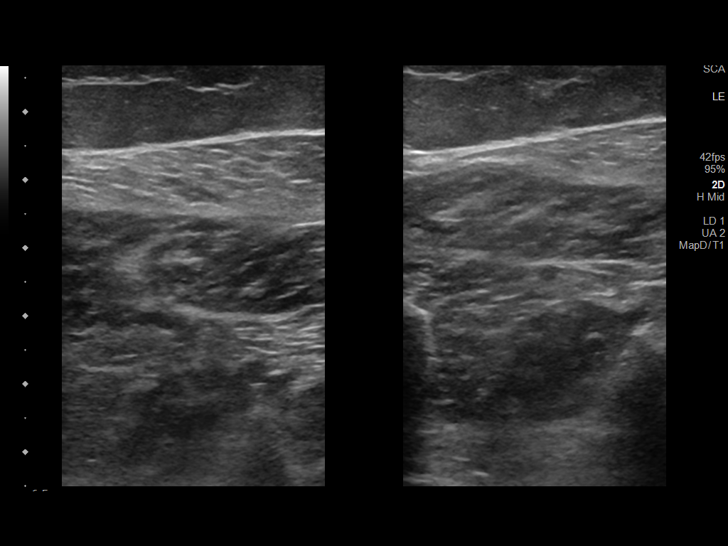
[im 34/34]
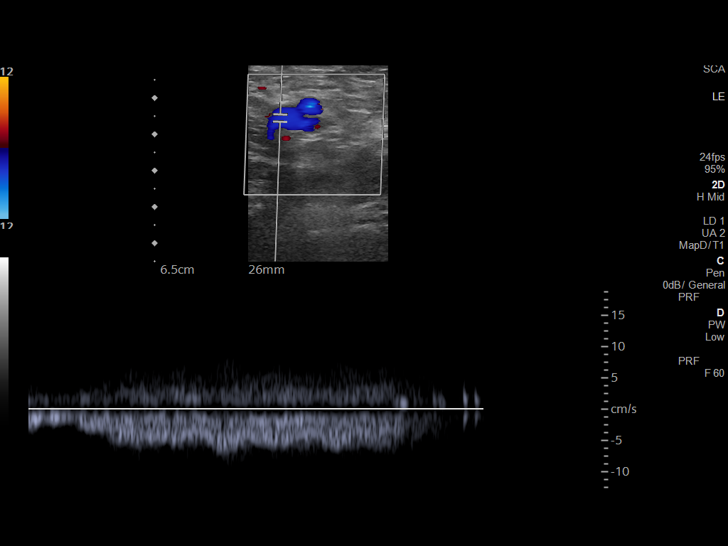

[14 of 24 positions shown; findings below may reference images not displayed]

FINDINGS: VENOUS

Normal compressibility of the common femoral, superficial femoral,
and popliteal veins, as well as the visualized calf veins.
Visualized portions of profunda femoral vein and great saphenous
vein unremarkable. No filling defects to suggest DVT on grayscale or
color Doppler imaging. Doppler waveforms show normal direction of
venous flow, normal respiratory plasticity and response to
augmentation.

Limited views of the contralateral common femoral vein are
unremarkable.

OTHER

None.

Limitations: none
IMPRESSION: Negative for DVT in the right lower extremity.

## 2022-02-26 ENCOUNTER — Ambulatory Visit: Payer: 59

## 2022-02-26 NOTE — Therapy (Incomplete)
?OUTPATIENT PHYSICAL THERAPY FEMALE PELVIC EVALUATION ? ? ?Patient Name: Tasha CoveMaria T Martin ?MRN: [id number] ?DOB:01-Sep-1986, 36 y.o., female ?Today's Date: 02/26/2022 ? ? ? ?Past Medical History:  ?Diagnosis Date  ? Abnormal Pap smear 2004  ? cryo  ? Carbuncle 01/2011  ? CIN I (cervical intraepithelial neoplasia I) 05/2004  ? H/O candidiasis   ? H/O constipation 03/2009  ? H/O dysmenorrhea 01/2004  ? H/O measles   ? H/O varicella   ? Headache(784.0)   ? migraines  ? 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 Hx: UTI (urinary tract infection)   ? Frequently during childhood   ? Pain pelvic 01/2005  ? Vaginal Pap smear, abnormal   ? ?Past Surgical History:  ?Procedure Laterality Date  ? CESAREAN SECTION  11/23/2011  ? Procedure: CESAREAN SECTION;  Surgeon: Janine LimboArthur V Stringer, MD;  Location: [facility];  Service: Gynecology;  Laterality: N/A;  primary cesarean section of baby girl at 2248 ? APGAR  9/9  ? CESAREAN SECTION WITH BILATERAL TUBAL LIGATION Bilateral 07/16/2020  ? Procedure: REPEAT CESAREAN SECTION WITH BILATERAL TUBAL LIGATION;  Surgeon: Jaymes Graffillard, Naima, MD;  Location: [facility];  Service: Obstetrics;  Laterality: Bilateral;  ? CRYOABLATION    ? DILATION AND EVACUATION N/A 12/17/2018  ? Procedure: SUCTION DILATATION AND EVACUATION;  Surgeon: Jaymes Graffillard, Naima, MD;  Location: [facility];  Service: Gynecology;  Laterality: N/A;  with Ultrasound  ? DILATION AND EVACUATION N/A 04/22/2019  ? Procedure: Suction DILATATION AND EVACUATION;  Surgeon: Silverio Layivard, Sandra, MD;  Location: [facility];  Service: Gynecology;  Laterality: N/A;  ? MOUTH SURGERY  2011  ? WISDOM TOOTH EXTRACTION  2011  ? WRIST SURGERY    ? ?Patient Active Problem List  ? Diagnosis Date Noted  ? Postpartum care following cesarean delivery 8/9 07/17/2020  ? Maternal anemia, with delivery 07/17/2020  ? Previous cesarean section 07/16/2020  ? S/P cesarean section (8/9) 07/16/2020  ? Hx of abnormal cervical Pap smear had cryo in 2011 11/23/2011  ? Rh negative, maternal  11/23/2011  ? ? ?PCP: Lupita RaiderShaw, Kimberlee, MD ? ?REFERRING PROVIDER: Lupita RaiderShaw, Kimberlee, MD ? ?REFERRING DIAG: M62.89 (ICD-10-CM) - Other specified disorders of muscle ?N39.0 (ICD-10-CM) - Urinary tract infection, site not specified ?K59.09 (ICD-10-CM) - Other constipation ? ? ?THERAPY DIAG:  ?No diagnosis found. ? ?ONSET DATE: *** ? ?SUBJECTIVE:                                                                                                                                                                                          ? ?SUBJECTIVE STATEMENT: ?*** ?Fluid intake: {Yes/No:304960894}  ? ?Patient confirms identification and  approves PT to assess pelvic floor and treatment Yes ? ? ?PAIN:  ?Are you having pain? {yes/no:20286} ?NPRS scale: ***/10 ?Pain location: {pelvic pain location:27098} ? ?Pain type: {type:313116} ?Pain description: {PAIN DESCRIPTION:21022940}  ? ?Aggravating factors: *** ?Relieving factors: *** ? ?PRECAUTIONS: None ? ?WEIGHT BEARING RESTRICTIONS No ? ?FALLS:  ?Has patient fallen in last 6 months? No, Number of falls: 0 ? ?LIVING ENVIRONMENT: ?Lives with: lives with their family ?Lives in: House/apartment ? ? ?OCCUPATION: *** ? ?PLOF: Independent ? ?PATIENT GOALS *** ? ?PERTINENT HISTORY:  ?*** ?Sexual abuse: {Yes/No:304960894} ? ?BOWEL MOVEMENT ?Pain with bowel movement: {yes/no:20286} ?Type of bowel movement:{PT BM type:27100} ?Fully empty rectum: {Yes/No:304960894} ?Leakage: {Yes/No:304960894} ?Pads: {Yes/No:304960894} ?Fiber supplement: {Yes/No:304960894} ? ?URINATION ?Pain with urination: {yes/no:20286} ?Fully empty bladder: {Yes/No:304960894} ?Stream: {PT urination:27102} ?Urgency: {Yes/No:304960894} ?Frequency: *** ?Leakage: {PT leakage:27103} ?Pads: {Yes/No:304960894} ? ?INTERCOURSE ?Pain with intercourse: {pain with intercourse PA:27099} ?Ability to have vaginal penetration:  {Yes/No:304960894} ?Climax: *** ?Marinoff Scale: ***/3 ? ?PREGNANCY ?Vaginal deliveries *** ?Tearing  {Yes***/No:304960894} ?C-section deliveries *** ?Currently pregnant {Yes***/No:304960894} ? ? ?OBJECTIVE:  ? ?DIAGNOSTIC FINDINGS:  ?*** ? ?PATIENT SURVEYS:  ?{rehab surveys:24030} ? ?PFIQ-7 *** ? ?COGNITION: ? Overall cognitive status: Within functional limits for tasks assessed   ?  ?SENSATION: ? Light touch: {intact/deficits:24005} ? Proprioception: {intact/deficits:24005} ? ?MUSCLE LENGTH: ?Hamstrings: Right *** deg; Left *** deg ?Thomas test: Right *** deg; Left *** deg ? ?FUNCTIONAL TESTS:  ?{Functional tests:24029} ? ? ?POSTURE:  ?*** ? ?LUMBARAROM/PROM ? ?A/PROM A/PROM  ?02/26/2022  ?Flexion   ?Extension   ?Right lateral flexion   ?Left lateral flexion   ?Right rotation   ?Left rotation   ? (Blank rows = not tested) ? ?LE ROM: ? ?{AROM/PROM:27142} ROM Right ?02/26/2022 Left ?02/26/2022  ?Hip flexion    ?Hip extension    ?Hip abduction    ?Hip adduction    ?Hip internal rotation    ?Hip external rotation    ?Knee flexion    ?Knee extension    ?Ankle dorsiflexion    ?Ankle plantarflexion    ?Ankle inversion    ?Ankle eversion    ? (Blank rows = not tested) ? ?LE MMT: ? ?MMT Right ?02/26/2022 Left ?02/26/2022  ?Hip flexion    ?Hip extension    ?Hip abduction    ?Hip adduction    ?Hip internal rotation    ?Hip external rotation    ?Knee flexion    ?Knee extension    ?Ankle dorsiflexion    ?Ankle plantarflexion    ?Ankle inversion    ?Ankle eversion    ? ?PELVIC MMT: ?  ?MMT  ?02/26/2022  ?Vaginal   ?Internal Anal Sphincter   ?External Anal Sphincter   ?Puborectalis   ?Diastasis Recti   ?(Blank rows = not tested) ? ?      PALPATION: ?  General  *** ? ?              External Perineal Exam *** ?              ?              Internal Pelvic Floor *** ? ?TONE: ?*** ? ?PROLAPSE: ?*** ? ?TODAY'S TREATMENT 02/26/22 ?EVAL  ?Treatment: ? ? ?PATIENT EDUCATION:  ?Education details: *** ?Person educated: Patient ?Education method: Explanation, Demonstration, Tactile cues, Verbal cues, and Handouts ?Education comprehension:  verbalized understanding ? ? ?HOME EXERCISE PROGRAM: ?*** ? ?ASSESSMENT: ? ?CLINICAL IMPRESSION: ?Patient is a 36 y.o. female who was seen today for physical therapy evaluation  and treatment for ***.  ? ? ?OBJECTIVE IMPAIRMENTS {opptimpairments:25111}.  ? ?ACTIVITY LIMITATIONS {activity limitations:25113}.  ? ?PERSONAL FACTORS {Personal factors:25162} are also affecting patient's functional outcome.  ? ? ?REHAB POTENTIAL: Good ? ?CLINICAL DECISION MAKING: Stable/uncomplicated ? ?EVALUATION COMPLEXITY: Low ? ? ?GOALS: ?Goals reviewed with patient? Yes ? ?SHORT TERM GOALS: Target date: 03/26/2022 ? ?Pt will be independent with HEP.  ? ?Baseline:  ?Goal status: INITIAL ? ?2.  *** ?Baseline: *** ?Goal status: {GOALSTATUS:25110} ? ?3.  *** ?Baseline: *** ?Goal status: {GOALSTATUS:25110} ? ?4.  *** ?Baseline: *** ?Goal status: {GOALSTATUS:25110} ? ?5.  *** ?Baseline: *** ?Goal status: {GOALSTATUS:25110} ? ?6.  *** ?Baseline: *** ?Goal status: {GOALSTATUS:25110} ? ?LONG TERM GOALS: Target date: 05/21/2022 ? ?Pt will be independent with advanced HEP.  ? ?Baseline:  ?Goal status: INITIAL ? ?2.  *** ?Baseline: *** ?Goal status: {GOALSTATUS:25110} ? ?3.  *** ?Baseline: *** ?Goal status: {GOALSTATUS:25110} ? ?4.  *** ?Baseline: *** ?Goal status: {GOALSTATUS:25110} ? ?5.  *** ?Baseline: *** ?Goal status: {GOALSTATUS:25110} ? ?6.  *** ?Baseline: *** ?Goal status: {GOALSTATUS:25110} ? ? ?PLAN: ?PT FREQUENCY: 1x/week ? ?PT DURATION: 12 weeks ? ?PLANNED INTERVENTIONS: Therapeutic exercises, Therapeutic activity, Neuromuscular re-education, Balance training, Gait training, Patient/Family education, Joint mobilization, Dry Needling, Biofeedback, and Manual therapy ? ?PLAN FOR NEXT SESSION: *** ? ?Julio Alm, PT, DPT03/22/239:26 AM ? ? ?

## 2022-02-28 ENCOUNTER — Encounter (HOSPITAL_BASED_OUTPATIENT_CLINIC_OR_DEPARTMENT_OTHER): Payer: Self-pay | Admitting: *Deleted

## 2022-02-28 ENCOUNTER — Other Ambulatory Visit: Payer: Self-pay

## 2022-02-28 ENCOUNTER — Emergency Department (HOSPITAL_BASED_OUTPATIENT_CLINIC_OR_DEPARTMENT_OTHER)
Admission: EM | Admit: 2022-02-28 | Discharge: 2022-02-28 | Disposition: A | Discharge status: Home / Self Care | Payer: 59 | Attending: Emergency Medicine | Admitting: Emergency Medicine

## 2022-02-28 DIAGNOSIS — R0789 Other chest pain: Secondary | ICD-10-CM | POA: Insufficient documentation

## 2022-02-28 DIAGNOSIS — R059 Cough, unspecified: Secondary | ICD-10-CM | POA: Diagnosis not present

## 2022-02-28 DIAGNOSIS — J069 Acute upper respiratory infection, unspecified: Secondary | ICD-10-CM

## 2022-02-28 MED ORDER — GUAIFENESIN-CODEINE 100-10 MG/5ML PO SOLN: 10.0000 mL | Freq: Four times a day (QID) | ORAL | 0 refills | Status: DC | PRN

## 2022-02-28 NOTE — ED Provider Notes (Signed)
?[facility] ?Provider Note ? ? ?CSN: [id number] ?Arrival date & time: 02/28/22  [id number] ? ?  ? ?History ? ?Chief Complaint  ?Patient presents with  ? Cough  ? ? ?Tasha Martin is a 36 y.o. female. ? ?Patient is a 36 year old female presenting with complaints of URI symptoms.  She describes chest congestion, cough that is occasionally productive, scratchy throat that has worsened over the past several days.  She is here with her son who is ill in a similar fashion.  She denies any chest pain or difficulty breathing.  She has tried over-the-counter medications with little relief. ? ?The history is provided by the patient.  ? ?  ? ?Home Medications ?Prior to Admission medications   ?Medication Sig Start Date End Date Taking? Authorizing Provider  ?ibuprofen (ADVIL) 800 MG tablet Take 1 tablet (800 mg total) by mouth every 8 (eight) hours as needed for moderate pain. 07/18/20   Crumpler, Marveen Reeks, CNM  ?oxyCODONE-acetaminophen (PERCOCET/ROXICET) 5-325 MG tablet Take 1-2 tablets by mouth every 4 (four) hours as needed for moderate pain. 07/18/20   Crumpler, Marveen Reeks, CNM  ?   ? ?Allergies    ?Patient has no known allergies.   ? ?Review of Systems   ?Review of Systems  ?All other systems reviewed and are negative. ? ?Physical Exam ?Updated Vital Signs ?BP 116/77   Pulse 80   Temp 98.3 ?F (36.8 ?C) (Oral)   Resp 18   Ht 5\' 5"  (1.651 m)   Wt 92.5 kg   LMP 02/12/2022   SpO2 99%   BMI 33.95 kg/m?  ?Physical Exam ?Vitals and nursing note reviewed.  ?Constitutional:   ?   General: She is not in acute distress. ?   Appearance: She is well-developed. She is not diaphoretic.  ?HENT:  ?   Head: Normocephalic and atraumatic.  ?Cardiovascular:  ?   Rate and Rhythm: Normal rate and regular rhythm.  ?   Heart sounds: No murmur heard. ?  No friction rub. No gallop.  ?Pulmonary:  ?   Effort: Pulmonary effort is normal. No respiratory distress.  ?   Breath sounds: Normal breath sounds. No wheezing.   ?Abdominal:  ?   General: Bowel sounds are normal. There is no distension.  ?   Palpations: Abdomen is soft.  ?   Tenderness: There is no abdominal tenderness.  ?Musculoskeletal:     ?   General: Normal range of motion.  ?   Cervical back: Normal range of motion and neck supple.  ?Skin: ?   General: Skin is warm and dry.  ?Neurological:  ?   General: No focal deficit present.  ?   Mental Status: She is alert and oriented to person, place, and time.  ? ? ?ED Results / Procedures / Treatments   ?Labs ?(all labs ordered are listed, but only abnormal results are displayed) ?Labs Reviewed - No data to display ? ?EKG ?None ? ?Radiology ?No results found. ? ?Procedures ?Procedures  ? ? ?Medications Ordered in ED ?Medications - No data to display ? ?ED Course/ Medical Decision Making/ A&P ? ?Patient presenting with URI symptoms that are most likely viral in nature.  Oxygen levels are normal and vital signs are stable.  Patient to be continued with over-the-counter medications.  I will also prescribe Robitussin with codeine which she can take as needed at night for cough. ? ?Final Clinical Impression(s) / ED Diagnoses ?Final diagnoses:  ?None  ? ? ?Rx / DC  Orders ?ED Discharge Orders   ? ? None  ? ?  ? ? ?  ?Veryl Speak, MD ?02/28/22 [phone] ? ?

## 2022-02-28 NOTE — Discharge Instructions (Addendum)
Continue over-the-counter medications as needed for symptom relief. ? ?Take Robitussin with codeine as prescribed as needed for cough.  Take this medication primarily at night to help you get some rest. ? ?Follow-up with primary doctor if symptoms are not improving in the next week. ?

## 2022-02-28 NOTE — ED Triage Notes (Signed)
Cough since Sunday. Taking OTC cough meds without relief.  ?

## 2022-06-27 ENCOUNTER — Ambulatory Visit
Admission: EM | Admit: 2022-06-27 | Discharge: 2022-06-27 | Disposition: A | Discharge status: Home / Self Care | Payer: 59 | Attending: Urgent Care | Admitting: Urgent Care

## 2022-06-27 DIAGNOSIS — R0982 Postnasal drip: Secondary | ICD-10-CM | POA: Diagnosis not present

## 2022-06-27 DIAGNOSIS — J988 Other specified respiratory disorders: Secondary | ICD-10-CM

## 2022-06-27 DIAGNOSIS — B9789 Other viral agents as the cause of diseases classified elsewhere: Secondary | ICD-10-CM | POA: Diagnosis not present

## 2022-06-27 DIAGNOSIS — R052 Subacute cough: Secondary | ICD-10-CM | POA: Diagnosis not present

## 2022-06-27 MED ORDER — BENZONATATE 100 MG PO CAPS: 100.0000 mg | ORAL_CAPSULE | Freq: Three times a day (TID) | ORAL | 0 refills | Status: DC | PRN

## 2022-06-27 MED ORDER — PROMETHAZINE-DM 6.25-15 MG/5ML PO SYRP: 5.0000 mL | ORAL_SOLUTION | Freq: Every evening | ORAL | 0 refills | Status: DC | PRN

## 2022-06-27 MED ORDER — PSEUDOEPHEDRINE HCL 60 MG PO TABS: 60.0000 mg | ORAL_TABLET | Freq: Three times a day (TID) | ORAL | 0 refills | Status: DC | PRN

## 2022-06-27 MED ORDER — LEVOCETIRIZINE DIHYDROCHLORIDE 5 MG PO TABS: 5.0000 mg | ORAL_TABLET | Freq: Every evening | ORAL | 0 refills | Status: DC

## 2022-06-27 NOTE — Discharge Instructions (Addendum)
We will manage this as a viral respiratory illness. For sore throat or cough try using a honey-based tea. Use 3 teaspoons of honey with juice squeezed from half lemon. Place shaved pieces of ginger into 1/2-1 cup of water and warm over stove top. Then mix the ingredients and repeat every 4 hours as needed. Please take Tylenol 500mg -650mg  every 6 hours for aches and pains, fevers. Hydrate very well with at least 2 liters of water. Eat light meals such as soups to replenish electrolytes and soft fruits, veggies. Start an antihistamine like Zyrtec for postnasal drainage, sinus congestion.  You can take this together with pseudoephedrine (Sudafed) at a dose of 60 mg 2-3 times a day as needed for the same kind of congestion.  Use cough medications as needed.

## 2022-06-27 NOTE — ED Notes (Addendum)
Productive Cough x 3 day, Has been using OTC Cough/cold. No SOB, wheezing of Chest pains. Before the Productive cough started patient states she had been vomiting.

## 2022-06-27 NOTE — ED Triage Notes (Signed)
The pt c/o productive Cough x 3 days. Pt denies SOB, and wheezing.  Home interventions: Has been using OTC Cough/cold.

## 2022-06-27 NOTE — ED Provider Notes (Signed)
[facility]   MRN: [id number] DOB: 12-Mar-1986  Subjective:   Tasha Martin is a 36 y.o. female presenting for 3-day history of productive cough.  Initially had sinus symptoms but this is improved.  Her primary problem is having this persistent hacking cough.  No chest pain, shortness of breath or wheezing.  No history of asthma.  Patient has been using over-the-counter cough and cold medications.  No current facility-administered medications for this encounter.  Current Outpatient Medications:    guaiFENesin-codeine 100-10 MG/5ML syrup, Take 10 mLs by mouth every 6 (six) hours as needed for cough., Disp: 120 mL, Rfl: 0   ibuprofen (ADVIL) 800 MG tablet, Take 1 tablet (800 mg total) by mouth every 8 (eight) hours as needed for moderate pain., Disp: 30 tablet, Rfl: 0   oxyCODONE-acetaminophen (PERCOCET/ROXICET) 5-325 MG tablet, Take 1-2 tablets by mouth every 4 (four) hours as needed for moderate pain., Disp: 30 tablet, Rfl: 0   No Known Allergies  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   Hx: UTI (urinary tract infection)    Frequently during childhood    Pain pelvic 01/2005   Vaginal Pap smear, abnormal      Past Surgical History:  Procedure Laterality Date   CESAREAN SECTION  11/23/2011   Procedure: CESAREAN SECTION;  Surgeon: Janine Limbo, MD;  Location: [facility];  Service: Gynecology;  Laterality: N/A;  primary cesarean section of baby girl at 2248  APGAR  9/9   CESAREAN SECTION WITH BILATERAL TUBAL LIGATION Bilateral 07/16/2020   Procedure: REPEAT CESAREAN SECTION WITH BILATERAL TUBAL LIGATION;  Surgeon: Jaymes Graff, MD;  Location: [facility];  Service: Obstetrics;   Laterality: Bilateral;   CRYOABLATION     DILATION AND EVACUATION N/A 12/17/2018   Procedure: SUCTION DILATATION AND EVACUATION;  Surgeon: Jaymes Graff, MD;  Location: [facility];  Service: Gynecology;  Laterality: N/A;  with Ultrasound   DILATION AND EVACUATION N/A 04/22/2019   Procedure: Suction DILATATION AND EVACUATION;  Surgeon: Silverio Lay, MD;  Location: [facility];  Service: Gynecology;  Laterality: N/A;   MOUTH SURGERY  2011   WISDOM TOOTH EXTRACTION  2011   WRIST SURGERY      Family History  Problem Relation Age of Onset   Hypertension Mother    Hypertension Father    COPD Father    Heart disease Father    Arthritis Maternal Grandmother    Diabetes Maternal Grandmother    Hypertension Maternal Grandmother    Arthritis Maternal Grandfather    Diabetes Maternal Grandfather    Hypertension Maternal Grandfather    Hypertension Paternal Grandmother    Hypertension Paternal Grandfather    Cancer Maternal Aunt        ovarian   Alcohol abuse Maternal Uncle     Social History   Tobacco Use   Smoking status: Never   Smokeless tobacco: Never  Vaping Use   Vaping Use: Never used  Substance Use Topics   Alcohol use: Not Currently    Comment: OCCASIONAL   Drug use: No    ROS   Objective:   Vitals: BP 109/76 (  BP Location: Right Arm)   Pulse 73   Temp 98.1 F (36.7 C) (Oral)   Resp 16   LMP 06/17/2022   SpO2 97%   Physical Exam Constitutional:      General: She is not in acute distress.    Appearance: Normal appearance. She is well-developed. She is not ill-appearing, toxic-appearing or diaphoretic.  HENT:     Head: Normocephalic and atraumatic.     Nose: Nose normal.     Mouth/Throat:     Mouth: Mucous membranes are moist.     Pharynx: No pharyngeal swelling, oropharyngeal exudate, posterior oropharyngeal erythema or uvula swelling.     Tonsils: No tonsillar exudate or tonsillar abscesses. 0 on the right. 0 on the left.     Comments:  Significant post-nasal drainage. Eyes:     General: No scleral icterus.       Right eye: No discharge.        Left eye: No discharge.     Extraocular Movements: Extraocular movements intact.  Cardiovascular:     Rate and Rhythm: Normal rate and regular rhythm.     Heart sounds: Normal heart sounds. No murmur heard.    No friction rub. No gallop.  Pulmonary:     Effort: Pulmonary effort is normal. No respiratory distress.     Breath sounds: No stridor. No wheezing, rhonchi or rales.  Chest:     Chest wall: No tenderness.  Skin:    General: Skin is warm and dry.  Neurological:     General: No focal deficit present.     Mental Status: She is alert and oriented to person, place, and time.  Psychiatric:        Mood and Affect: Mood normal.        Behavior: Behavior normal.     Assessment and Plan :   PDMP not reviewed this encounter.  1. Viral respiratory infection   2. Subacute cough   3. Post-nasal drainage    Deferred imaging given clear cardiopulmonary exam, hemodynamically stable vital signs.  Deferred COVID-19 testing. Suspect viral URI, viral syndrome. Physical exam findings reassuring and vital signs stable for discharge. Advised supportive care, offered symptomatic relief. Counseled patient on potential for adverse effects with medications prescribed/recommended today, ER and return-to-clinic precautions discussed, patient verbalized understanding.     Wallis Bamberg, [place] 06/27/22 1818

## 2022-08-23 ENCOUNTER — Other Ambulatory Visit: Payer: Self-pay

## 2022-08-23 ENCOUNTER — Encounter (HOSPITAL_BASED_OUTPATIENT_CLINIC_OR_DEPARTMENT_OTHER): Payer: Self-pay

## 2022-08-23 ENCOUNTER — Emergency Department (HOSPITAL_BASED_OUTPATIENT_CLINIC_OR_DEPARTMENT_OTHER): Payer: 59

## 2022-08-23 ENCOUNTER — Emergency Department (HOSPITAL_BASED_OUTPATIENT_CLINIC_OR_DEPARTMENT_OTHER)
Admission: EM | Admit: 2022-08-23 | Discharge: 2022-08-23 | Disposition: A | Discharge status: Home / Self Care | Payer: 59 | Attending: Emergency Medicine | Admitting: Emergency Medicine

## 2022-08-23 DIAGNOSIS — I499 Cardiac arrhythmia, unspecified: Secondary | ICD-10-CM | POA: Diagnosis not present

## 2022-08-23 DIAGNOSIS — I459 Conduction disorder, unspecified: Secondary | ICD-10-CM

## 2022-08-23 DIAGNOSIS — R002 Palpitations: Secondary | ICD-10-CM | POA: Diagnosis present

## 2022-08-23 LAB — BASIC METABOLIC PANEL
Anion gap: 8 (ref 5–15)
BUN: 14 mg/dL (ref 6–20)
CO2: 26 mmol/L (ref 22–32)
Calcium: 10.1 mg/dL (ref 8.9–10.3)
Chloride: 106 mmol/L (ref 98–111)
Creatinine, Ser: 0.86 mg/dL (ref 0.44–1.00)
GFR, Estimated: >60 mL/min (ref >60)
Glucose, Bld: 85 mg/dL (ref 70–99)
Potassium: 3.6 mmol/L (ref 3.5–5.1)
Sodium: 140 mmol/L (ref 135–145)

## 2022-08-23 LAB — CBC
HCT: 39.6 % (ref 36.0–46.0)
Hemoglobin: 13.3 g/dL (ref 12.0–15.0)
MCH: 31.7 pg (ref 26.0–34.0)
MCHC: 33.6 g/dL (ref 30.0–36.0)
MCV: 94.5 fL (ref 80.0–100.0)
Platelets: 283 10*3/uL (ref 150–400)
RBC: 4.19 MIL/uL (ref 3.87–5.11)
RDW: 12.3 % (ref 11.5–15.5)
WBC: 5.8 10*3/uL (ref 4.0–10.5)
nRBC: 0 % (ref 0.0–0.2)

## 2022-08-23 LAB — PREGNANCY, URINE: Preg Test, Ur: NEGATIVE

## 2022-08-23 NOTE — ED Triage Notes (Signed)
Pt states x 1 month, she has had heart flutters. Pt states 2 weeks ago, she also had change in taste but that has subsided.

## 2022-08-23 NOTE — ED Provider Notes (Signed)
[facility] EMERGENCY DEPT Provider Note   CSN: [id number] Arrival date & time: 08/23/22  7846     History  Chief Complaint  Patient presents with   Palpitations    Tasha Martin is a 36 y.o. female presenting to the ED with palpitations.  Patient reports that she is noted for the past month she has occasional skipped heartbeats.  She also had a runny nose and a loss of smell or taste about 2 weeks ago, but her taste is returned.  She denies any other significant medical history.  No history of arrhythmia.  She feels fine now.  She drinks 1 cup of coffee a day.  She does not drink alcohol regularly.  She does not smoke or use illicit drugs.  HPI     Home Medications Prior to Admission medications   Medication Sig Start Date End Date Taking? Authorizing Provider  benzonatate (TESSALON) 100 MG capsule Take 1-2 capsules (100-200 mg total) by mouth 3 (three) times daily as needed for cough. 06/27/22   Wallis Bamberg, PA-C  levocetirizine (XYZAL) 5 MG tablet Take 1 tablet (5 mg total) by mouth every evening. 06/27/22   Wallis Bamberg, PA-C  promethazine-dextromethorphan (PROMETHAZINE-DM) 6.25-15 MG/5ML syrup Take 5 mLs by mouth at bedtime as needed for cough. 06/27/22   Wallis Bamberg, PA-C  pseudoephedrine (SUDAFED) 60 MG tablet Take 1 tablet (60 mg total) by mouth every 8 (eight) hours as needed for congestion. 06/27/22   Wallis Bamberg, PA-C      Allergies    Patient has no known allergies.    Review of Systems   Review of Systems  Physical Exam Updated Vital Signs BP 112/70   Pulse (!) 55   Temp 98.1 F (36.7 C) (Oral)   Resp 16   Ht 5\' 6"  (1.676 m)   Wt 90.7 kg   LMP 08/17/2022   SpO2 100%   BMI 32.28 kg/m  Physical Exam Constitutional:      General: She is not in acute distress. HENT:     Head: Normocephalic and atraumatic.  Eyes:     Conjunctiva/sclera: Conjunctivae normal.     Pupils: Pupils are equal, round, and reactive to light.  Cardiovascular:     Rate  and Rhythm: Normal rate and regular rhythm.  Pulmonary:     Effort: Pulmonary effort is normal. No respiratory distress.  Abdominal:     General: There is no distension.     Tenderness: There is no abdominal tenderness.  Skin:    General: Skin is warm and dry.  Neurological:     General: No focal deficit present.     Mental Status: She is alert. Mental status is at baseline.  Psychiatric:        Mood and Affect: Mood normal.        Behavior: Behavior normal.     ED Results / Procedures / Treatments   Labs (all labs ordered are listed, but only abnormal results are displayed) Labs Reviewed  BASIC METABOLIC PANEL  CBC  PREGNANCY, URINE    EKG EKG Interpretation  Date/Time:  Saturday August 23 2022 19:03:48 EDT Ventricular Rate:  65 PR Interval:  160 QRS Duration: 84 QT Interval:  366 QTC Calculation: 380 R Axis:   48 Text Interpretation: Normal sinus rhythm Normal ECG No previous ECGs available Confirmed by 07[birth year]28[birth year]1987 [phone]) on 08/23/2022 8:52:14 PM  Radiology DG Chest Port 1 View  Result Date: 08/23/2022 CLINICAL DATA:  Palpitations EXAM: PORTABLE CHEST 1 VIEW  COMPARISON:  None Available. FINDINGS: Lungs are clear.  No pleural effusion or pneumothorax. The heart is normal in size. IMPRESSION: No evidence of acute cardiopulmonary disease. Electronically Signed   By: Julian Hy M.D.   On: 08/23/2022 20:20    Procedures Procedures    Medications Ordered in ED Medications - No data to display  ED Course/ Medical Decision Making/ A&P                           Medical Decision Making Amount and/or Complexity of Data Reviewed Labs: ordered. Radiology: ordered.   Patient is here with palpitations and I suspect this likely asymptomatic PVCs.  She is well-appearing on exam.  Telemetry reviewed with no acute events here.  She is in a regular sinus rhythm, with some bradycardia that is physiological for her age and health.  Her electrolytes are  unremarkable per my review of her labs.  Her EKG per my interpretation shows sinus rhythm no acute ischemic findings or arrhythmia.  I believe she is reasonably safe and stable for discharge.  We discussed reducing her caffeine intake.  She may also been experiencing these PVCs in the setting of a recent viral illness per her description.  If her symptoms become more persistent or increase in frequency she can see cardiology, however at this time I do not see an emergent indication for cardiology referral.  An office number was provided.  She is content with this plan.        Final Clinical Impression(s) / ED Diagnoses Final diagnoses:  Skipped heart beats    Rx / DC Orders ED Discharge Orders     None         Wyvonnia Dusky, MD 08/23/22 2110

## 2022-10-12 ENCOUNTER — Ambulatory Visit
Admission: EM | Admit: 2022-10-12 | Discharge: 2022-10-12 | Disposition: A | Discharge status: Home / Self Care | Payer: 59 | Attending: Emergency Medicine | Admitting: Emergency Medicine

## 2022-10-12 DIAGNOSIS — N3090 Cystitis, unspecified without hematuria: Secondary | ICD-10-CM

## 2022-10-12 DIAGNOSIS — N760 Acute vaginitis: Secondary | ICD-10-CM

## 2022-10-12 LAB — POCT URINALYSIS DIP (MANUAL ENTRY)
Bilirubin, UA: NEGATIVE
Blood, UA: NEGATIVE
Glucose, UA: NEGATIVE mg/dL
Ketones, POC UA: NEGATIVE mg/dL
Nitrite, UA: POSITIVE — AB
Protein Ur, POC: 30 mg/dL — AB
Spec Grav, UA: >=1.03 — AB (ref 1.010–1.025)
Urobilinogen, UA: 1 E.U./dL
pH, UA: 5.5 (ref 5.0–8.0)

## 2022-10-12 MED ORDER — SULFAMETHOXAZOLE-TRIMETHOPRIM 800-160 MG PO TABS: 1.0000 | ORAL_TABLET | Freq: Two times a day (BID) | ORAL | 0 refills | Status: AC

## 2022-10-12 MED ORDER — TERCONAZOLE 0.4 % VA CREA: TOPICAL_CREAM | VAGINAL | 0 refills | Status: DC

## 2022-10-12 NOTE — ED Provider Notes (Signed)
[facility]    CSN: [id number] Arrival date & time: 10/12/22  1036    HISTORY  No chief complaint on file.  HPI Tasha Martin is a pleasant, 36 y.o. female who presents to urgent care today. Patient reports a 3 to 4-day history of vaginal itching and burning with urination.  Patient states she took Azo 3 days ago without meaningful relief of her symptoms.  Patient states burning occurs during urination.  Patient denies abnormal odor of urine, increased frequency of urination, increased urge to urinate, sensation of incomplete emptying, suprapubic pain, perineal pain, flank pain, left-sided flank pain, right-sided flank pain, fever, chills, malaise, rigors, significant fatigue, abnormal vaginal discharge, vaginal irritation, genital lesion(s), and possible exposure to STD.     The history is provided by the patient.   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   Hx: UTI (urinary tract infection)    Frequently during childhood    Pain pelvic 01/2005   Vaginal Pap smear, abnormal    Patient Active Problem List   Diagnosis Date Noted   Postpartum care following cesarean delivery 8/9 07/17/2020   Maternal anemia, with delivery 07/17/2020   Previous cesarean section 07/16/2020   S/P cesarean section (8/9) 07/16/2020   Hx of abnormal cervical Pap smear had cryo in 2011 11/23/2011   Rh negative, maternal 11/23/2011   Past Surgical History:  Procedure Laterality Date   CESAREAN SECTION  11/23/2011   Procedure: CESAREAN SECTION;  Surgeon: Eli Hose, MD;  Location: [facility];  Service: Gynecology;  Laterality: N/A;  primary cesarean section of baby girl at 2248  APGAR  9/9   [place] Bilateral 07/16/2020   Procedure: REPEAT CESAREAN SECTION WITH BILATERAL TUBAL LIGATION;  Surgeon: Crawford Givens, MD;  Location: [facility];  Service: Obstetrics;  Laterality: Bilateral;   CRYOABLATION     DILATION AND EVACUATION N/A 12/17/2018   Procedure: SUCTION DILATATION AND EVACUATION;  Surgeon: Crawford Givens, MD;  Location: [facility];  Service: Gynecology;  Laterality: N/A;  with Ultrasound   DILATION AND EVACUATION N/A 04/22/2019   Procedure: Suction DILATATION AND EVACUATION;  Surgeon: Delsa Bern, MD;  Location: [facility];  Service: Gynecology;  Laterality: N/A;   MOUTH SURGERY  2011   WISDOM TOOTH EXTRACTION  2011   WRIST SURGERY     OB History     Gravida  4   Para  2   Term  2   Preterm  0   AB  2   Living  2      SAB  2   IAB  0   Ectopic  0   Multiple  0   Live Births  2          Home Medications    Prior to Admission medications   Medication Sig Start Date End Date Taking? Authorizing Provider  benzonatate (TESSALON) 100 MG capsule Take 1-2 capsules (100-200 mg total) by mouth 3 (three) times daily as needed for cough. 06/27/22   Jaynee Eagles, PA-C  levocetirizine (XYZAL) 5 MG tablet Take 1 tablet (5 mg total) by  mouth every evening. 06/27/22   Wallis Bamberg, PA-C  promethazine-dextromethorphan (PROMETHAZINE-DM) 6.25-15 MG/5ML syrup Take 5 mLs by mouth at bedtime as needed for cough. 06/27/22   Wallis Bamberg, PA-C  pseudoephedrine (SUDAFED) 60 MG tablet Take 1 tablet (60 mg total) by mouth every 8 (eight) hours as needed for congestion. 06/27/22   Wallis Bamberg, PA-C    Family History Family History  Problem Relation Age of Onset   Hypertension Mother    Hypertension Father    COPD Father    Heart disease Father    Arthritis Maternal Grandmother    Diabetes Maternal Grandmother    Hypertension Maternal Grandmother    Arthritis Maternal Grandfather    Diabetes Maternal Grandfather    Hypertension Maternal  Grandfather    Hypertension Paternal Grandmother    Hypertension Paternal Grandfather    Cancer Maternal Aunt        ovarian   Alcohol abuse Maternal Uncle    Social History Social History   Tobacco Use   Smoking status: Never   Smokeless tobacco: Never  Vaping Use   Vaping Use: Never used  Substance Use Topics   Alcohol use: Not Currently    Comment: OCCASIONAL   Drug use: No   Allergies   Patient has no known allergies.  Review of Systems Review of Systems Pertinent findings revealed after performing a 14 point review of systems has been noted in the history of present illness.  Physical Exam Triage Vital Signs ED Triage Vitals  Enc Vitals Group     BP 10/04/21 0827 (!) 147/82     Pulse Rate 10/04/21 0827 72     Resp 10/04/21 0827 18     Temp 10/04/21 0827 98.3 F (36.8 C)     Temp Source 10/04/21 0827 Oral     SpO2 10/04/21 0827 98 %     Weight --      Height --      Head Circumference --      Peak Flow --      Pain Score 10/04/21 0826 5     Pain Loc --      Pain Edu? --      Excl. in GC? --   No data found.  Updated Vital Signs There were no vitals taken for this visit.  Physical Exam Vitals and nursing note reviewed.  Constitutional:      General: She is not in acute distress.    Appearance: Normal appearance. She is not ill-appearing.  HENT:     Head: Normocephalic and atraumatic.  Eyes:     General: Lids are normal.        Right eye: No discharge.        Left eye: No discharge.     Extraocular Movements: Extraocular movements intact.     Conjunctiva/sclera: Conjunctivae normal.     Right eye: Right conjunctiva is not injected.     Left eye: Left conjunctiva is not injected.  Neck:     Trachea: Trachea and phonation normal.  Cardiovascular:     Rate and Rhythm: Normal rate and regular rhythm.     Pulses: Normal pulses.     Heart sounds: Normal heart sounds. No murmur heard.    No friction rub. No gallop.  Pulmonary:     Effort: Pulmonary  effort is normal. No accessory muscle usage, prolonged expiration or respiratory distress.     Breath sounds: Normal breath sounds. No stridor, decreased air movement or transmitted upper airway  sounds. No decreased breath sounds, wheezing, rhonchi or rales.  Chest:     Chest wall: No tenderness.  Abdominal:     General: Abdomen is flat. Bowel sounds are normal. There is no distension.     Palpations: Abdomen is soft.     Tenderness: There is abdominal tenderness in the suprapubic area. There is no right CVA tenderness or left CVA tenderness.     Hernia: No hernia is present.  Musculoskeletal:        General: Normal range of motion.     Cervical back: Normal range of motion and neck supple. Normal range of motion.  Lymphadenopathy:     Cervical: No cervical adenopathy.  Skin:    General: Skin is warm and dry.     Findings: No erythema or rash.  Neurological:     General: No focal deficit present.     Mental Status: She is alert and oriented to person, place, and time.  Psychiatric:        Mood and Affect: Mood normal.        Behavior: Behavior normal.     Visual Acuity Right Eye Distance:   Left Eye Distance:   Bilateral Distance:    Right Eye Near:   Left Eye Near:    Bilateral Near:     UC Couse / Diagnostics / Procedures:     Radiology No results found.  Procedures Procedures (including critical care time) EKG  Pending results:  Labs Reviewed  POCT URINALYSIS DIP (MANUAL ENTRY)    Medications Ordered in UC: Medications - No data to display  UC Diagnoses / Final Clinical Impressions(s)   I have reviewed the triage vital signs and the nursing notes.  Pertinent labs & imaging results that were available during my care of the patient were reviewed by me and considered in my medical decision making (see chart for details).    Final diagnoses:  Vaginitis and vulvovaginitis  Cystitis    STD screening was performed, patient advised that the results be posted  to their MyChart and if any of the results are positive, they will be notified by phone, further treatment will be provided as indicated based on results of STD screening. Terconazole provided for vaginal discomfort while awaiting results of STD testing. Urine pregnancy test was negative.  Urine dip today was positive for nitrites, pyuria, and cloudy appearance.  Urine culture will be performed per our protocol.   Patient was advised to begin antibiotics now due to findings on urine dip. Patient was advised to begin antibiotics today due to having active symptoms of urinary tract infection.                    Patient was advised to take all doses exactly as prescribed.  Patient also advised of risks of worsening infection with incomplete antibiotic therapy. Return precautions advised.  ED Prescriptions     Medication Sig Dispense Auth. Provider   sulfamethoxazole-trimethoprim (BACTRIM DS) 800-160 MG tablet Take 1 tablet by mouth 2 (two) times daily for 3 days. 6 tablet Theadora Rama Scales, PA-C   terconazole (TERAZOL 7) 0.4 % vaginal cream Apply twice daily to vulvovaginal area, can reapply after every void, use for 7 days as needed 45 g Theadora Rama Scales, PA-C      PDMP not reviewed this encounter.  Disposition Upon Discharge:  Condition: stable for discharge home  Patient presented with concern for an acute illness with associated systemic symptoms and significant discomfort requiring  urgent management. In my opinion, this is a condition that a prudent lay person (someone who possesses an average knowledge of health and medicine) may potentially expect to result in complications if not addressed urgently such as respiratory distress, impairment of bodily function or dysfunction of bodily organs.   As such, the patient has been evaluated and assessed, work-up was performed and treatment was provided in alignment with urgent care protocols and evidence based medicine.   Patient/parent/caregiver has been advised that the patient may require follow up for further testing and/or treatment if the symptoms continue in spite of treatment, as clinically indicated and appropriate.  Routine symptom specific, illness specific and/or disease specific instructions were discussed with the patient and/or caregiver at length.  Prevention strategies for avoiding STD exposure were also discussed.  The patient will follow up with their current PCP if and as advised. If the patient does not currently have a PCP we will assist them in obtaining one.   The patient may need specialty follow up if the symptoms continue, in spite of conservative treatment and management, for further workup, evaluation, consultation and treatment as clinically indicated and appropriate.  Patient/parent/caregiver verbalized understanding and agreement of plan as discussed.  All questions were addressed during visit.  Please see discharge instructions below for further details of plan.  Discharge Instructions: Discharge Instructions   None     This office note has been dictated using Dragon speech recognition software.  Unfortunately, this method of dictation can sometimes lead to typographical or grammatical errors.  I apologize for your inconvenience in advance if this occurs.  Please do not hesitate to reach out to me if clarification is needed.       Theadora Rama Scales, [place] 10/15/22 [phone]

## 2022-10-12 NOTE — ED Triage Notes (Signed)
Pt c/o vaginal itching and burning with urination.   Started: 3-4 day ago   Home interventions: AZO (last taken Friday)

## 2022-10-12 NOTE — Discharge Instructions (Signed)
Common causes of urinary tract infections include but are not limited to holding your urine longer than you should, squatting instead of sitting down when urinating, sitting around in wet clothing such as a wet swimsuit or gym clothes too long, not emptying your bladder after having sexual intercourse, wiping from back to front instead of front to back after having a bowel movement.     The urinalysis that we performed in the clinic today was abnormal.     You were advised to begin antibiotics today because your urinalysis is abnormal and you are having active symptoms of an acute lower urinary tract infection also known as cystitis.     Please pick up and begin taking your prescription for Bactrim DS (trimethoprim sulfamethoxazole) as soon as possible.  Please take all doses exactly as prescribed.  You can take this medication with or without food.  This medication is safe to take with your other medications.   For comfort, while you are waiting for the result of your vaginal swab test, I have provided you with an external yeast infection cream that you can apply 3-4 times daily for relief of vaginal itching and burning.  I recommend that you abstain from sexual intercourse, tampon use or any other other intravaginal activities while you are waiting on these results and possible treatment.   The results of your vaginal swab test which tests for BV, yeast, gonorrhea, chlamydia and trichomonas will be posted to your MyChart account in the next 3 to 5 days.  If any of your results are abnormal, you will receive a phone call regarding further treatment.  Additional prescriptions, if any are needed, will be provided for you at your pharmacy.   Please abstain from sexual intercourse of any kind, vaginal, oral or anal, until until you have received the results of your test.   If you have not had complete resolution of your symptoms after completing any needed treatment, please return for repeat evaluation.    Thank you for visiting urgent care today.  I appreciate the opportunity to participate in your care.

## 2022-10-13 LAB — CERVICOVAGINAL ANCILLARY ONLY
Bacterial Vaginitis (gardnerella): NEGATIVE
Candida Glabrata: NEGATIVE
Candida Vaginitis: POSITIVE — AB
Chlamydia: NEGATIVE
Comment: NEGATIVE
Comment: NEGATIVE
Comment: NEGATIVE
Comment: NEGATIVE
Comment: NEGATIVE
Comment: NORMAL
Neisseria Gonorrhea: NEGATIVE
Trichomonas: NEGATIVE

## 2022-10-14 ENCOUNTER — Telehealth (HOSPITAL_COMMUNITY): Payer: Self-pay | Admitting: Emergency Medicine

## 2022-10-14 MED ORDER — FLUCONAZOLE 150 MG PO TABS: 150.0000 mg | ORAL_TABLET | Freq: Once | ORAL | 0 refills | Status: AC

## 2022-12-31 ENCOUNTER — Other Ambulatory Visit: Payer: Self-pay

## 2022-12-31 ENCOUNTER — Emergency Department (HOSPITAL_BASED_OUTPATIENT_CLINIC_OR_DEPARTMENT_OTHER)
Admission: EM | Admit: 2022-12-31 | Discharge: 2022-12-31 | Disposition: A | Discharge status: Home / Self Care | Payer: 59 | Attending: Emergency Medicine | Admitting: Emergency Medicine

## 2022-12-31 ENCOUNTER — Encounter (HOSPITAL_BASED_OUTPATIENT_CLINIC_OR_DEPARTMENT_OTHER): Payer: Self-pay

## 2022-12-31 DIAGNOSIS — U071 COVID-19: Secondary | ICD-10-CM

## 2022-12-31 DIAGNOSIS — R059 Cough, unspecified: Secondary | ICD-10-CM | POA: Diagnosis present

## 2022-12-31 LAB — RESP PANEL BY RT-PCR (RSV, FLU A&B, COVID)  RVPGX2
Influenza A by PCR: NEGATIVE
Influenza B by PCR: NEGATIVE
Resp Syncytial Virus by PCR: NEGATIVE
SARS Coronavirus 2 by RT PCR: POSITIVE — AB

## 2022-12-31 MED ORDER — ACETAMINOPHEN 500 MG PO TABS
1000.0000 mg | ORAL_TABLET | Freq: Once | ORAL | Status: AC
  Administered 2022-12-31: 1000 mg via ORAL
  Filled 2022-12-31: qty 2

## 2022-12-31 MED ORDER — KETOROLAC TROMETHAMINE 15 MG/ML IJ SOLN
15.0000 mg | Freq: Once | INTRAMUSCULAR | Status: AC
  Administered 2022-12-31: 15 mg via INTRAMUSCULAR
  Filled 2022-12-31: qty 1

## 2022-12-31 NOTE — ED Provider Notes (Signed)
[facility] Provider Note   CSN: [id number] Arrival date & time: 12/31/22  1734     History  Chief Complaint  Patient presents with   Cough    Tasha Martin is a 37 y.o. female with no significant PMH presents with cough, mild headache, body aches, chills, and nasal congestion for ~1 week. Denies CP, SOB, abd pain, N/V/D/C, sore throat, visual changes ,numbness/tingling, leg swelling. Patient states her school-aged daughter is sick with similar symptoms.    Cough      Home Medications Prior to Admission medications   Medication Sig Start Date End Date Taking? Authorizing Provider  levocetirizine (XYZAL) 5 MG tablet Take 1 tablet (5 mg total) by mouth every evening. 06/27/22   Jaynee Eagles, PA-C  terconazole (TERAZOL 7) 0.4 % vaginal cream Apply twice daily to vulvovaginal area, can reapply after every void, use for 7 days as needed 10/12/22   Lynden Oxford Scales, PA-C      Allergies    Patient has no known allergies.    Review of Systems   Review of Systems  Respiratory:  Positive for cough.    Review of systems Negative for fevers.  A 10 point review of systems was performed and is negative unless otherwise reported in HPI.  Physical Exam Updated Vital Signs BP 124/76 (BP Location: Right Arm)   Pulse 80   Temp 98.4 F (36.9 C)   Resp 17   Ht 5\' 6"  (1.676 m)   Wt 90.7 kg   SpO2 98%   BMI 32.27 kg/m  Physical Exam General: Normal appearing female, lying in bed.  HEENT: Sclera anicteric, MMM, trachea midline. Clear oropharynx. Cardiology: RRR, no murmurs/rubs/gallops.  Resp: Normal respiratory rate and effort. CTAB, no wheezes, rhonchi, crackles.  Abd: Soft, non-tender, non-distended. No rebound tenderness or guarding.  GU: Deferred. MSK: No peripheral edema or signs of trauma. No cyanosis or clubbing. Skin: warm, dry. No rashes or lesions. Back: No CVA tenderness Neuro: A&Ox4, CNs II-XII grossly intact. MAEs.   Psych: Normal mood and affect.   ED Results / Procedures / Treatments   Labs (all labs ordered are listed, but only abnormal results are displayed) Labs Reviewed  RESP PANEL BY RT-PCR (RSV, FLU A&B, COVID)  [id number] - Abnormal; Notable for the following components:      Result Value   SARS Coronavirus 2 by RT PCR POSITIVE (*)    All other components within normal limits  PREGNANCY, URINE    EKG None  Radiology No results found.  Procedures Procedures    Medications Ordered in ED Medications  acetaminophen (TYLENOL) tablet 1,000 mg (1,000 mg Oral Given 12/31/22 2207)  ketorolac (TORADOL) 15 MG/ML injection 15 mg (15 mg Intramuscular Given 12/31/22 2206)    ED Course/ Medical Decision Making/ A&P                          Medical Decision Making Amount and/or Complexity of Data Reviewed Labs:  Decision-making details documented in ED Course.  Risk OTC drugs. Prescription drug management.    MDM:    With patient's constellation of symptoms greatest concern for a viral upper respiratory infection such as COVID flu or RSV.  Patient has no shortness of breath, fever, hypoxia or abnormal lung sounds to suggest pneumonia.  She is overall well-appearing and hemodynamically stable, low concern for serious bacterial infection or sepsis.  She has no sore throat and a clear  oropharynx, no concern for pharyngitis/tonsillitis.  Clinical Course as of 12/31/22 2312  Wed Dec 31, 2022  2042 SARS Coronavirus 2 by RT PCR(!): POSITIVE [HN]    Clinical Course User Index [HN] Audley Hose, MD    Labs: I Ordered, and personally interpreted labs.  The pertinent results include:  +Covid  Additional history obtained from chart review  Social Determinants of Health: Patient lives independently   Disposition: Patient is extremely well-appearing, hemodynamic stable, afebrile. +Covid 19 swab. Patient instructed to take tylenol/ibuprofen for bodyaches, headache, and mucinex for cough,  tea with honey. Patient given tylenol/toradol here. Given DC instructions/return precautions, all questions answered to patient satisfaction.  Advised to follow-up with her primary care physician in 1 to 2 weeks.  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    Audley Hose, MD 12/31/22 [phone]

## 2022-12-31 NOTE — ED Triage Notes (Signed)
Patient here POV from Home.  Endorses Cough, Headache, Chills. Productive cough at times. Present for approximately 1 Week.   No Cough. No Known fevers.   NAD Noted during Triage. A&Ox4. GCS 15. Ambulatory.

## 2022-12-31 NOTE — Discharge Instructions (Addendum)
Thank you for coming to [facility] Emergency Department. You were seen for cold-like symptoms. We did an exam, labs, and  these showed covid-19 infection.  Please follow up with your primary care provider within 1 week.   Do not hesitate to return to the ED or call 911 if you experience: -Worsening symptoms -Lightheadedness, passing out -Fevers/chills -Anything else that concerns you

## 2024-03-17 ENCOUNTER — Encounter (HOSPITAL_COMMUNITY): Payer: Self-pay

## 2024-03-17 ENCOUNTER — Ambulatory Visit (HOSPITAL_COMMUNITY)
Admission: EM | Admit: 2024-03-17 | Discharge: 2024-03-17 | Disposition: A | Discharge status: Home / Self Care | Attending: Family Medicine | Admitting: Family Medicine

## 2024-03-17 DIAGNOSIS — R140 Abdominal distension (gaseous): Secondary | ICD-10-CM | POA: Diagnosis not present

## 2024-03-17 DIAGNOSIS — R1084 Generalized abdominal pain: Secondary | ICD-10-CM

## 2024-03-17 LAB — POCT URINALYSIS DIP (MANUAL ENTRY)
Bilirubin, UA: NEGATIVE
Glucose, UA: NEGATIVE mg/dL
Ketones, POC UA: NEGATIVE mg/dL
Leukocytes, UA: NEGATIVE
Nitrite, UA: NEGATIVE
Protein Ur, POC: NEGATIVE mg/dL
Spec Grav, UA: 1.02 (ref 1.010–1.025)
Urobilinogen, UA: 1 U/dL
pH, UA: 6.5 (ref 5.0–8.0)

## 2024-03-17 LAB — POCT URINE PREGNANCY: Preg Test, Ur: NEGATIVE

## 2024-03-17 MED ORDER — PANTOPRAZOLE SODIUM 40 MG PO TBEC: 40.0000 mg | DELAYED_RELEASE_TABLET | Freq: Every day | ORAL | 2 refills | Status: AC

## 2024-03-17 NOTE — ED Triage Notes (Signed)
 Patient here today with c/o bloated feeling, tightness, and fullness. Patient states that it started with constipation 2 weeks ago and took Dulcolax with some relief. Patient states that she has been having some BM but it doesn't feel like a full release.

## 2024-03-17 NOTE — ED Provider Notes (Signed)
 [facility]   [id number] 03/17/24 Arrival Time: 1735  ASSESSMENT & PLAN:  1. Abdominal discomfort, generalized   2. Abdominal bloating    Suspect GERD related. Trial of: Meds ordered this encounter  Medications   pantoprazole (PROTONIX) 40 MG tablet    Sig: Take 1 tablet (40 mg total) by mouth daily.    Dispense:  30 tablet    Refill:  2   Labs Reviewed  POCT URINALYSIS DIP (MANUAL ENTRY) - Abnormal; Notable for the following components:      Result Value   Blood, UA trace-intact (*)    All other components within normal limits  POCT URINE PREGNANCY   On menstrual cycle. UPT negative.   Follow-up Information     [facility].   Specialty: Urgent Care Why: If worsening or failing to improve as anticipated. Contact information: [address] [place] [address] [phone]                Reviewed expectations re: course of current medical issues. Questions answered. Outlined signs and symptoms indicating need for more acute intervention. Patient verbalized understanding. After Visit Summary given.   SUBJECTIVE: History from: patient.  Tasha Martin is a 38 y.o. female who presents with complaint abdominal bloating; noted over past 3 weeks. Denies n/v/fever. Normal PO intake. Bloating is worse post-prandially. Gas-X without much relief. H/O constipation but is moving bowels normally now; non-bloody.  Patient's last menstrual period was 03/13/2024 (approximate).  Past Surgical History:  Procedure Laterality Date   CESAREAN SECTION  11/23/2011   Procedure: CESAREAN SECTION;  Surgeon: Janine Limbo, MD;  Location: [facility];  Service: Gynecology;  Laterality: N/A;  primary cesarean section of baby girl at 2248  APGAR  9/9   CESAREAN SECTION WITH BILATERAL TUBAL LIGATION Bilateral 07/16/2020   Procedure: REPEAT CESAREAN SECTION WITH BILATERAL TUBAL LIGATION;  Surgeon: Jaymes Graff, MD;  Location: [facility];  Service: Obstetrics;  Laterality: Bilateral;   CRYOABLATION     DILATION AND EVACUATION N/A 12/17/2018   Procedure: SUCTION DILATATION AND EVACUATION;  Surgeon: Jaymes Graff, MD;  Location: [facility];  Service: Gynecology;  Laterality: N/A;  with Ultrasound   DILATION AND EVACUATION N/A 04/22/2019   Procedure: Suction DILATATION AND EVACUATION;  Surgeon: Silverio Lay, MD;  Location: [facility];  Service: Gynecology;  Laterality: N/A;   MOUTH SURGERY  2011   WISDOM TOOTH EXTRACTION  2011   WRIST SURGERY      OBJECTIVE:  Vitals:   03/17/24 1803 03/17/24 1807  BP:  98/64  Pulse:  65  Resp:  16  Temp:  98 F (36.7 C)  TempSrc:  Oral  SpO2:  97%  Weight: 90.7 kg   Height: 5\' 5"  (1.651 m)     General appearance: alert; no distress Abdomen: soft; non-distended; no significant abdominal tenderness Skin: warm; dry Neurologic: normal gait Psychological: alert and cooperative; normal mood and affect  Labs: Results for orders placed or performed during the hospital encounter of 03/17/24  POC urinalysis dipstick   Collection Time: 03/17/24  6:21 PM  Result Value Ref Range   Color, UA yellow yellow   Clarity, UA clear clear   Glucose, UA negative negative mg/dL   Bilirubin, UA negative negative   Ketones, POC UA negative negative mg/dL   Spec Grav, UA 4.401 0.272 - 1.025   Blood, UA trace-intact (A) negative   pH, UA 6.5 5.0 - 8.0  Protein Ur, POC negative negative mg/dL   Urobilinogen, UA 1.0 0.2 or 1.0 E.U./dL   Nitrite, UA Negative Negative   Leukocytes, UA Negative Negative  POCT urine pregnancy   Collection Time: 03/17/24  6:21 PM  Result Value Ref Range   Preg Test, Ur Negative Negative   Labs Reviewed  POCT URINALYSIS DIP (MANUAL ENTRY) - Abnormal; Notable for the following components:      Result Value   Blood, UA trace-intact (*)    All other components within normal limits  POCT URINE PREGNANCY    Imaging: No results found.  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   Hx: UTI (urinary tract infection)    Frequently during childhood    Pain pelvic 01/2005   Vaginal Pap smear, abnormal    Social History   Socioeconomic History   Marital status: Married    Spouse name: Not on file   Number of children: Not on file   Years of education: Not on file   Highest education level: Not on file  Occupational History   Not on file  Tobacco Use   Smoking status: Never   Smokeless tobacco: Never  Vaping Use   Vaping status: Never Used  Substance and Sexual Activity   Alcohol use: Yes    Comment: OCCASIONAL   Drug use: No   Sexual activity: Not Currently    Birth control/protection: Condom, Injection    Comment: DEPO PROVERA  Other Topics Concern   Not on file  Social History Narrative   Not on file   Social Drivers of Health   Financial Resource Strain: Not on file  Food Insecurity: Low Risk  (06/19/2023)   Received from [facility]   Hunger Vital Sign    Worried About Running Out of Food in the Last Year: Never true    Ran Out of Food in the Last Year: Never true  Transportation Needs: No Transportation Needs (06/19/2023)   Received from [facility]    In the past 12 months, has lack of reliable transportation kept you from medical appointments, meetings, work or from getting things needed for daily living? : No  Physical Activity: Not on file  Stress: Not on file  Social Connections: Not on file  Intimate Partner Violence: Not on file   Family History  Problem Relation Age of Onset   Hypertension Mother    Hypertension Father    COPD Father     Heart disease Father    Arthritis Maternal Grandmother    Diabetes Maternal Grandmother    Hypertension Maternal Grandmother    Arthritis Maternal Grandfather    Diabetes Maternal Grandfather    Hypertension Maternal Grandfather    Hypertension Paternal Grandmother    Hypertension Paternal Grandfather    Cancer Maternal Aunt        ovarian  Alcohol abuse Maternal Guido Sander, MD 03/17/24 [phone]

## 2024-08-16 ENCOUNTER — Emergency Department (HOSPITAL_BASED_OUTPATIENT_CLINIC_OR_DEPARTMENT_OTHER)

## 2024-08-16 ENCOUNTER — Encounter (HOSPITAL_BASED_OUTPATIENT_CLINIC_OR_DEPARTMENT_OTHER): Payer: Self-pay

## 2024-08-16 ENCOUNTER — Emergency Department (HOSPITAL_BASED_OUTPATIENT_CLINIC_OR_DEPARTMENT_OTHER)
Admission: EM | Admit: 2024-08-16 | Discharge: 2024-08-17 | Disposition: A | Discharge status: Home / Self Care | Attending: Emergency Medicine | Admitting: Emergency Medicine

## 2024-08-16 ENCOUNTER — Other Ambulatory Visit: Payer: Self-pay

## 2024-08-16 DIAGNOSIS — W208XXA Other cause of strike by thrown, projected or falling object, initial encounter: Secondary | ICD-10-CM | POA: Insufficient documentation

## 2024-08-16 DIAGNOSIS — S91311A Laceration without foreign body, right foot, initial encounter: Secondary | ICD-10-CM | POA: Insufficient documentation

## 2024-08-16 DIAGNOSIS — M67969 Unspecified disorder of synovium and tendon, unspecified lower leg: Secondary | ICD-10-CM | POA: Insufficient documentation

## 2024-08-16 NOTE — ED Triage Notes (Signed)
 Pt presents via POV with laceration to top of right foot. Reports dropped crock pot on foot.

## 2024-08-17 ENCOUNTER — Telehealth: Payer: Self-pay | Admitting: Orthopedic Surgery

## 2024-08-17 MED ORDER — LIDOCAINE HCL 1 % IJ SOLN: 20.0000 mL | Freq: Once | INTRAMUSCULAR | Status: DC

## 2024-08-17 MED ORDER — OXYCODONE-ACETAMINOPHEN 5-325 MG PO TABS: 1.0000 | ORAL_TABLET | Freq: Three times a day (TID) | ORAL | 0 refills | Status: AC | PRN

## 2024-08-17 MED ORDER — LIDOCAINE HCL 1 % IJ SOLN
INTRAMUSCULAR | Status: AC
  Filled 2024-08-17: qty 20

## 2024-08-17 MED ORDER — LIDOCAINE HCL (PF) 1 % IJ SOLN: 5.0000 mL | Freq: Once | INTRAMUSCULAR | Status: DC

## 2024-08-17 MED ORDER — LIDOCAINE HCL (PF) 1 % IJ SOLN
20.0000 mL | Freq: Once | INTRAMUSCULAR | Status: DC
  Filled 2024-08-17: qty 20

## 2024-08-17 MED ORDER — LIDOCAINE-EPINEPHRINE-TETRACAINE (LET) TOPICAL GEL
3.0000 mL | Freq: Once | TOPICAL | Status: AC
  Administered 2024-08-17: 3 mL via TOPICAL
  Filled 2024-08-17: qty 3

## 2024-08-17 NOTE — ED Provider Notes (Signed)
 Emergency Department Provider Note  TRIAGE NOTE: Pt presents via POV with laceration to top of right foot. Reports dropped crock pot on foot.   HISTORY  Chief Complaint Laceration   HPI Tasha Martin is a 38 y.o. female with a foot injury after accidentally dropping a crock pot filled with cooked collard greens onto their foot. The incident resulted in a cut on the foot. The patient reports numbness and tingling in the big toe, with an inability to wiggle it, while the other toes can move, albeit with some numbness. The sensation is described as similar to the tingling experienced when an arm falls asleep. The patient is unsure of their last tetanus shot. There is no mention of pain requiring medication at this time. The history was obtained from the patient.  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 Authorizing Provider  oxyCODONE -acetaminophen  (PERCOCET) 5-325 MG tablet Take 1 tablet by mouth every 8 (eight) hours as needed for severe pain (pain score 7-10). 08/17/24  Yes Ella Golomb, Selinda, MD  pantoprazole  (PROTONIX ) 40 MG tablet Take 1 tablet (40 mg total) by mouth daily. 03/17/24   Rolinda Rogue, MD    Social History Social History   Tobacco Use   Smoking status: Never   Smokeless tobacco: Never  Vaping Use   Vaping status: Never Used  Substance Use Topics   Alcohol use: Yes    Comment: OCCASIONAL   Drug use: No    Review of Systems: Documented in  HPI ____________________________________________  PHYSICAL EXAM: VITAL SIGNS: Triage: Blood pressure 110/70, pulse 64, temperature 98.4 F (36.9 C), temperature source Oral, resp. rate 16, SpO2 100%.  Vitals:   08/16/24 2345 08/17/24 0015 08/17/24 0030 08/17/24 0123  BP: 115/77 115/80 110/70   Pulse: 61 (!) 57 64   Resp:      Temp:    98.4 F (36.9 C)  TempSrc:    Oral  SpO2: 100% 100% 100%     Physical Exam Vitals and nursing note reviewed.  Constitutional:      Appearance: She is well-developed.  HENT:     Head: Normocephalic and atraumatic.  Cardiovascular:     Rate and Rhythm: Normal rate and regular rhythm.  Pulmonary:     Effort: No respiratory distress.     Breath sounds: No stridor.  Abdominal:     General: There is no distension.  Musculoskeletal:     Cervical back: Normal range of motion.  Skin:    Comments: 3 cm laceration to dorsum of right foot Can't move her right big toe and visualized the proximal portion of her right FHL but can't visualize the distal portion. Has paresthesias to toe as well.   Neurological:     Mental Status: She is alert.       ____________________________________________   LABS (all labs ordered are listed, but only abnormal results are displayed)  Labs Reviewed - No data to display ____________________________________________  EKG   EKG Interpretation Date/Time:    Ventricular Rate:    PR Interval:    QRS Duration:    QT Interval:    QTC Calculation:   R Axis:      Text Interpretation:          ____________________________________________  RADIOLOGY  DG Foot Complete Right Result Date: 08/16/2024 CLINICAL DATA:  Crush injury, dorsal soft tissue laceration EXAM: RIGHT FOOT COMPLETE - 3+ VIEW COMPARISON:  None Available. FINDINGS: Frontal, oblique, and lateral views of the right foot are obtained on 3 images. Dorsal soft tissue swelling and laceration within the forefoot. No fracture, subluxation, or  dislocation. Joint spaces are well preserved. IMPRESSION: Dorsal soft tissue swelling and laceration of the forefoot. No fracture or radiopaque foreign body. Electronically Signed   By: Ozell Daring M.D.   On: 08/16/2024 22:28   ____________________________________________  PROCEDURES  Procedure(s) performed:   .Laceration Repair  Date/Time: 08/17/2024 4:36 AM  Performed by: Lorette Mayo, MD Authorized by: Lorette Mayo, MD   Consent:    Consent obtained:  Verbal   Consent given by:  Patient   Risks, benefits, and alternatives were discussed: yes     Risks discussed:  Infection, need for additional repair, nerve damage, poor wound healing, poor cosmetic result, pain and retained foreign body   Alternatives discussed:  No treatment, delayed treatment, observation and referral Universal protocol:    Procedure explained and questions answered to patient or proxy's satisfaction: yes     Immediately prior to procedure, a time out was called: yes     Patient identity confirmed:  Verbally with patient and arm band Anesthesia:    Anesthesia method:  Topical application and local infiltration   Topical anesthetic:  LET   Local anesthetic:  Lidocaine  2% w/o epi Laceration details:    Location:  Foot   Foot location:  Top of R foot   Length (cm):  3   Depth (mm):  4 Pre-procedure details:    Preparation:  Patient was prepped and draped in usual sterile fashion Exploration:    Imaging obtained: x-ray     Imaging outcome: foreign body not noted     Wound exploration: wound explored through full range of motion and entire depth of wound visualized     Wound extent: nerve damage and tendon damage     Tendon damage location:  Lower extremity   Lower extremity tendon damage location:  FHL   Tendon damage extent:  Complete transection   Tendon repair plan:  Refer for evaluation   Contaminated: no   Treatment:    Area cleansed with:  Shur-Clens and soap and water   Amount of cleaning:   Standard   Irrigation solution:  Sterile water   Irrigation volume:  100   Irrigation method:  Syringe Skin repair:    Repair method:  Sutures   Suture size:  4-0   Suture material:  Prolene   Suture technique:  Simple interrupted   Number of sutures:  7 Approximation:    Approximation:  Close Repair type:    Repair type:  Simple Post-procedure details:    Dressing:  Antibiotic ointment, tube gauze and bulky dressing   Procedure completion:  Tolerated well, no immediate complications  ____________________________________________  INITIAL IMPRESSION / ASSESSMENT AND PLAN     Initial DDx:        ED Course   Patient with laceration top of her foot  with likely flexor houses longus transection.  Tetanus updated.  Discussed with Dr. Georgina with orthopedics regarding the tendon laceration and likely small nerve damage. Couldn't repair at bedside as I couldn't visualize the distal portion of the tendon. After discussign with him, Irrigated well and repaired at bedside pending follow-up with ortho for definitive repair of the tendon.  Postop shoe for comfort.      Images ordered viewed and obtained by myself. Agree with Radiology interpretation. Details in ED course.  Labs ordered reviewed by myself as detailed in ED course.  Consultations obtained/considered detailed in ED course.     FINAL IMPRESSION Final diagnoses:  Laceration of dorsum of foot, right, initial encounter  Disorder of flexor hallucis longus tendon     Disposition A medical screening exam was performed and I feel the patient has had an appropriate workup for their chief complaint at this time and likelihood of emergent condition existing is low. They have been counseled on decision, DISCHARGE, follow up and which symptoms necessitate immediate return to the emergency department. They or their family verbally stated understanding and agreement with plan and discharged in stable condition.    ____________________________________________   NEW OUTPATIENT MEDICATIONS STARTED DURING THIS VISIT:  Discharge Medication List as of 08/17/2024  1:15 AM     START taking these medications   Details  oxyCODONE -acetaminophen  (PERCOCET) 5-325 MG tablet Take 1 tablet by mouth every 8 (eight) hours as needed for severe pain (pain score 7-10)., Starting Wed 08/17/2024, Normal        Note:  This note was prepared with assistance of Dragon voice recognition software. Occasional wrong-word or sound-a-like substitutions may have occurred due to the inherent limitations of voice recognition software.    Jayshon Dommer, Selinda, MD 08/17/24 [phone]

## 2024-08-17 NOTE — Telephone Encounter (Signed)
 Patient called and is coming from the ER, and said that she needs to f/u asap for R foot disorder tendon. CB#[phone]  or [phone]

## 2024-08-17 NOTE — Telephone Encounter (Signed)
 I called and lmom at [phone], and called her husband and advised him that we can see her tomorrow @ 915, and if that doesn't work to call us  back otherwise we will see her then

## 2024-08-18 ENCOUNTER — Ambulatory Visit: Admitting: Orthopedic Surgery

## 2024-08-18 DIAGNOSIS — S96121A Laceration of muscle and tendon of long extensor muscle of toe at ankle and foot level, right foot, initial encounter: Secondary | ICD-10-CM | POA: Diagnosis not present

## 2024-08-18 MED ORDER — SULFAMETHOXAZOLE-TRIMETHOPRIM 800-160 MG PO TABS: 1.0000 | ORAL_TABLET | Freq: Two times a day (BID) | ORAL | 0 refills | Status: AC

## 2024-08-18 NOTE — Progress Notes (Signed)
 Orthopedic Surgery Progress Note   Assessment: Patient is a 38 y.o. female with right foot dorsal laceration and EHL tendon   Plan: - I talked about her injury with her.  The laceration appears well repaired so nice.  However, she does have a injury of the EHL.  Explained to her that she could treat this surgically to restore dorsiflexion at the great toe.  She could also consider nonoperative treatment but would not be able to release the great toe up.  I explained that a lot of patients do not notice it and cannot raise their right toe up but this could result in tripping and the toe drags.  She does not have a foot drop though so she would be able to clear her at the ankle.  After our conversation about some of the things and cons of EHL tendon repair, patient wanted to proceed with nonoperative treatment -Prescribed Bactrim  for a 1 week course -Okay to remove the dressings at 1 week and let soap/water run over the laceration repair but do not submerge -Will plan to see her back in 2 weeks to remove the sutures, x-rays at next visit: None  ___________________________________________________________________________  History: Patient was carrying a crockpot when she dropped it onto her right foot.  She noticed immediate onset of pain and there was a cut over the dorsal aspect of her foot.  She presented to the med center where her wound was irrigated and closed.  She has been using Percocet to control the joints prescribed at the ER.  Was not prescribed any antibiotics.  Not had any fevers or chills.  Has not noticed any drainage from the laceration since it was closed in rest by the ER.   Physical Exam:  General: no acute distress, appears stated age Neurologic: alert, answering questions appropriately, following commands Respiratory: unlabored breathing on room air, symmetric chest rise Psychiatric: appropriate affect, normal cadence to speech  MSK:   -Right lower extremity  Patient  with laceration over the dorsal aspect of the forefoot.  The laceration is over the great toe and 2nd and 3rd toes.  The laceration is well-approximated with sutures in place.  There is no active or expressible drainage.  TTP over the dorsal aspect of the midfoot and forefoot.  No other tenderness palpation. Fires hip flexors, quadriceps, hamstrings, tibialis anterior, gastrocnemius and soleus Plantarflexes and dorsiflexes toes except is unable to extend the great toe.  EHL is not intact Sensation intact to light touch in sural, saphenous, tibial, deep peroneal, and superficial peroneal nerve distributions Foot warm and well perfused   Imaging: XRs of the right foot from 08/16/2024 were independently reviewed and interpreted, showing no fracture or dislocation.  There is a density over the metacarpals distally that may represent a dressing or soft tissue swelling.   Patient name: Tasha Martin Patient MRN: [id number] Date: 08/18/24

## 2024-09-01 ENCOUNTER — Ambulatory Visit (INDEPENDENT_AMBULATORY_CARE_PROVIDER_SITE_OTHER): Admitting: Orthopedic Surgery

## 2024-09-01 DIAGNOSIS — S96121D Laceration of muscle and tendon of long extensor muscle of toe at ankle and foot level, right foot, subsequent encounter: Secondary | ICD-10-CM | POA: Diagnosis not present

## 2024-09-01 NOTE — Progress Notes (Signed)
 Orthopedic Office Note  Patient comes in today for routine follow-up on her right dorsal foot laceration and EHL tendon rupture.  Patient completed her course of Bactrim .  She has not noticed any redness or drainage around her wound.  She has been ambulating in a postop shoe.    The wound is well-approximated with no erythema, induration, active/expressible drainage.  No EHL function.  Able to plantarflex and dorsiflex remainder of toes.  Sensation intact to light touch on the medial and lateral aspects of the great toe.  Sensation intact to light touch in sural/saphenous/deep peroneal/superficial peroneal/tibial nerve distributions.  Foot warm well-perfused.  Sutures removed today in the office.  Steri-Strips applied.  Keep the foot dry for the next week.  Then, it is okay to let soap/water run over the wound.  Will do a recheck in 2 weeks.  Tasha DELENA Ada, MD Orthopedic Surgeon

## 2024-09-02 ENCOUNTER — Telehealth: Payer: Self-pay

## 2024-09-02 NOTE — Telephone Encounter (Signed)
 Patient advising she forgot to make a follow up appointment with Dr. Georgina, I didn't see anything available patient waiting on Christy to call and schedule f/u appt.

## 2024-09-08 NOTE — Telephone Encounter (Signed)
 Scheduled for 09/15/24 @ 1pm

## 2024-09-15 ENCOUNTER — Ambulatory Visit: Admitting: Orthopedic Surgery

## 2024-09-15 DIAGNOSIS — S96121D Laceration of muscle and tendon of long extensor muscle of toe at ankle and foot level, right foot, subsequent encounter: Secondary | ICD-10-CM

## 2024-09-15 NOTE — Progress Notes (Signed)
 Orthopedic Office Note  Patient comes in today for follow-up on her right dorsal foot laceration and extensor hallucis longus tendon rupture.  She has noticed interval healing of the wound.  No redness or drainage around the wound.  She has had the Steri-Strips in place since they were put on in the office.  She has been using a waterproof bag to prevent water from getting on wound.  She has noticed that the toe drops when she is not in shoes.  She is using shoes, she does not notice any toe dragging or issues with balance.  The wound over the dorsal forefoot is well-approximated with no erythema, induration, active/expressible drainage.  No dehiscence seen.  Unable to extend the great toe.  Able to extend remaining toes.  Able to plantarflex toes including great toe.  Sensation intact light touch in sural/saphenous/deep peroneal/superficial peroneal/tibial nerve distributions.  Foot warm and well-perfused  Can let soap/water run over the incision for the next 2 weeks.  Then it would be okay to submerge.  She can continue to weight-bear as tolerated in a postop shoe perform 1 more week and then transition to regular shoes.  I explained that is not too late to repair the EHL tendon.  She will have the inability to extend the great toe permanently.  The toe drag and she notes when not in shoes will be permanent.  I explained this to her.  I told her it will not get worse with time.  She will likely not have any balance issues or toed regular shoes since the sole of the shoe will support her.  After covering this, patient wanted to continue with nonoperative treatment.  I did discuss the fact that if we wait longer then the tendon would be irreparable.  If she does notice toe dragging in the future, then fusion would be the remaining option.  She expressed understanding and wants to continue with treatment.  She can follow-up in the office on an as-needed basis.   Ozell DELENA Ada, MD Orthopedic surgeon

## 2024-10-10 ENCOUNTER — Encounter: Payer: Self-pay | Admitting: Radiology
